# Patient Record
Sex: Female | Born: 1945 | Race: Black or African American | Hispanic: No | Smoking: Never smoker
Health system: Southern US, Community
[De-identification: ages and names within clinical notes are randomized; demographics above are authoritative.]

## PROBLEM LIST (undated history)

## (undated) DIAGNOSIS — E119 Type 2 diabetes mellitus without complications: Secondary | ICD-10-CM

## (undated) DIAGNOSIS — D8989 Other specified disorders involving the immune mechanism, not elsewhere classified: Secondary | ICD-10-CM

## (undated) HISTORY — DX: Type 2 diabetes mellitus without complications: E11.9

## (undated) HISTORY — PX: ABDOMINAL SURGERY: SHX537

## (undated) HISTORY — DX: Other specified disorders involving the immune mechanism, not elsewhere classified: D89.89

---

## 2017-06-28 ENCOUNTER — Emergency Department (HOSPITAL_COMMUNITY): Payer: Self-pay

## 2017-06-28 ENCOUNTER — Emergency Department (HOSPITAL_COMMUNITY)
Admission: EM | Admit: 2017-06-28 | Discharge: 2017-06-28 | Disposition: A | Discharge status: Home / Self Care | Payer: Self-pay | Attending: Physician Assistant | Admitting: Physician Assistant

## 2017-06-28 ENCOUNTER — Encounter (HOSPITAL_COMMUNITY): Payer: Self-pay

## 2017-06-28 DIAGNOSIS — R0789 Other chest pain: Secondary | ICD-10-CM | POA: Insufficient documentation

## 2017-06-28 DIAGNOSIS — R0602 Shortness of breath: Secondary | ICD-10-CM | POA: Insufficient documentation

## 2017-06-28 LAB — BASIC METABOLIC PANEL
Anion gap: 6 (ref 5–15)
BUN: 21 mg/dL — AB (ref 6–20)
CALCIUM: 9.2 mg/dL (ref 8.9–10.3)
CO2: 25 mmol/L (ref 22–32)
CREATININE: 0.98 mg/dL (ref 0.44–1.00)
Chloride: 105 mmol/L (ref 101–111)
GFR calc Af Amer: >60 mL/min (ref >60)
GFR calc non Af Amer: 57 mL/min — ABNORMAL LOW (ref >60)
GLUCOSE: 169 mg/dL — AB (ref 65–99)
Potassium: 4.7 mmol/L (ref 3.5–5.1)
Sodium: 136 mmol/L (ref 135–145)

## 2017-06-28 LAB — CBC
HCT: 32.9 % — ABNORMAL LOW (ref 36.0–46.0)
Hemoglobin: 11.2 g/dL — ABNORMAL LOW (ref 12.0–15.0)
MCH: 30.4 pg (ref 26.0–34.0)
MCHC: 34 g/dL (ref 30.0–36.0)
MCV: 89.2 fL (ref 78.0–100.0)
PLATELETS: 114 10*3/uL — AB (ref 150–400)
RBC: 3.69 MIL/uL — ABNORMAL LOW (ref 3.87–5.11)
RDW: 15 % (ref 11.5–15.5)
WBC: 4.1 10*3/uL (ref 4.0–10.5)

## 2017-06-28 LAB — I-STAT TROPONIN, ED
TROPONIN I, POC: 0 ng/mL (ref 0.00–0.08)
Troponin i, poc: 0 ng/mL (ref 0.00–0.08)

## 2017-06-28 MED ORDER — IOPAMIDOL (ISOVUE-370) INJECTION 76%
INTRAVENOUS | Status: AC
  Administered 2017-06-28: 100 mL via INTRAVENOUS
  Filled 2017-06-28: qty 100

## 2017-06-28 NOTE — ED Triage Notes (Addendum)
Per Pt and family, Pt is coming from [place] two weeks ago. Pt complains of chest pain and SOB for one year. Pt has no healthcare hx, but reports having abdominal surgery in 1988.

## 2017-06-28 NOTE — Discharge Instructions (Signed)
You were seen today with chest pain for the last year. We cannot find any acute cause of these. We do think you needs follow-up with a cardiologist.   Also you were found to have abnormal number of platelets and enlarged spleen, this may be chronic but you will need to see a primary for work up.  You to follow up with primary care physician.We already made an appointment for you, but will need to go to it.

## 2017-06-28 NOTE — ED Notes (Signed)
Pt taken to CT.

## 2017-06-28 NOTE — ED Provider Notes (Signed)
[facility]PT Provider Note   CSN: [id number] Arrival date & time: 06/28/17  0900     History   Chief Complaint Chief Complaint  Patient presents with  . Chest Pain    HPI Terri Long is a 71 y.o. female.  HPI  Patient is 71 year old female from [place]. Patient brought here by son. Son reports that he brought her here and states 2 weeks ago.  He brought her here for checkup. He reports that she's had chest pain and shortness of breath for over a year. It is mostly when walking long distances. Patient does not have any chest pain or shortness of breath currently. Patient had no past medical history except remote surgery.  Son is uses interpreter as patient speaks dialect is unobtainable by [employer].  History reviewed. No pertinent past medical history.  There are no active problems to display for this patient.   Past Surgical History:  Procedure Laterality Date  . ABDOMINAL SURGERY      OB History    No data available       Home Medications    Prior to Admission medications   Not on File    Family History No family history on file.  Social History Social History  Substance Use Topics  . Smoking status: Never Smoker  . Smokeless tobacco: Never Used  . Alcohol use No     Allergies   Patient has no known allergies.   Review of Systems Review of Systems  Respiratory: Positive for shortness of breath.   Cardiovascular: Positive for chest pain.  All other systems reviewed and are negative.    Physical Exam Updated Vital Signs BP (!) 128/94   Pulse 63   Temp 98.8 F (37.1 C) (Oral)   Resp 19   SpO2 98%   Physical Exam  Constitutional: She is oriented to person, place, and time. She appears well-developed and well-nourished.  HENT:  Head: Normocephalic and atraumatic.  Eyes: Pupils are equal, round, and reactive to light. EOM are normal. Right eye exhibits no discharge. Left eye exhibits no discharge.  Neck: Normal range  of motion. Neck supple.  Cardiovascular: Normal rate, regular rhythm and normal heart sounds.   No murmur heard. Pulmonary/Chest: Effort normal and breath sounds normal. She has no wheezes. She has no rales.  Abdominal: Soft. She exhibits no distension. There is no tenderness.  Neurological: She is oriented to person, place, and time.  Skin: Skin is warm and dry. She is not diaphoretic.  Psychiatric:  Unable to tell given language barrier.  Nursing note and vitals reviewed.    ED Treatments / Results  Labs (all labs ordered are listed, but only abnormal results are displayed) Labs Reviewed  BASIC METABOLIC PANEL - Abnormal; Notable for the following:       Result Value   Glucose, Bld 169 (*)    BUN 21 (*)    GFR calc non Af Amer 57 (*)    All other components within normal limits  CBC - Abnormal; Notable for the following:    RBC 3.69 (*)    Hemoglobin 11.2 (*)    HCT 32.9 (*)    Platelets 114 (*)    All other components within normal limits  I-STAT TROPONIN, ED  I-STAT TROPONIN, ED    EKG  EKG Interpretation None       Radiology Dg Chest 2 View  Result Date: 06/28/2017 CLINICAL DATA:  One year history of chest pain and shortness of  breath. Patient recently came to this country from [place]. EXAM: CHEST  2 VIEW COMPARISON:  None in PACs FINDINGS: The lungs are mildly hyperinflated. There is no focal infiltrate. There is no pleural effusion or pneumothorax. The heart is top-normal in size. The pulmonary vascularity is normal. There is tortuosity of the ascending and descending thoracic aorta. The bony thorax exhibits no acute abnormality. IMPRESSION: Tortuous thoracic aorta. An aneurysm is not excluded. A CT angiogram of the chest is recommended. Probable underline chronic bronchitic changes Electronically Signed   By: David  [place] M.D.   On: 06/28/2017 09:50   Ct Angio Chest Aorta W And/or Wo Contrast  Result Date: 06/28/2017 CLINICAL DATA:  Chest pain, shortness of  Breath. EXAM: CT ANGIOGRAPHY CHEST WITH CONTRAST TECHNIQUE: Multidetector CT imaging of the chest was performed using the standard protocol during bolus administration of intravenous contrast. Multiplanar CT image reconstructions and MIPs were obtained to evaluate the vascular anatomy. CONTRAST:  100 cc Isovue 370 IV COMPARISON:  06/28/2017 FINDINGS: Cardiovascular: Heart is mildly enlarged. Ectasia and tortuosity of the thoracic aorta without aneurysm. Scattered aortic calcifications. No filling defects in the pulmonary arteries to suggest pulmonary emboli. Mediastinum/Nodes: No mediastinal, hilar, or axillary adenopathy. Trachea and esophagus are unremarkable. Lungs/Pleura: Mild peribronchial thickening centrally and in the lower lobes. No confluent opacities or effusions. Upper Abdomen: There appears to be splenomegaly. The spleen is not imaged in its entirety. Gallstones noted within the gallbladder. Musculoskeletal: Chest wall soft tissues are unremarkable. No acute bony abnormality. Review of the MIP images confirms the above findings. IMPRESSION: No evidence of pulmonary embolus. Mild cardiomegaly. Tortuosity and ectasia of the thoracic aorta. Bronchial wall thickening compatible with bronchitis. Probable splenomegaly. The spleen is not imaged in its entirety on this CT chest. Electronically Signed   By: Charlett NoseKevin  Dover M.D.   On: 06/28/2017 13:39    Procedures Procedures (including critical care time)  Medications Ordered in ED Medications  iopamidol (ISOVUE-370) 76 % injection (100 mLs Intravenous Contrast Given 06/28/17 1319)     Initial Impression / Assessment and Plan / ED Course  I have reviewed the triage vital signs and the nursing notes.  Pertinent labs & imaging results that were available during my care of the patient were reviewed by me and considered in my medical decision making (see chart for details).     Well-appearing 71 year old female with no past medical history presenting  with chest pain and shortness of breath with lost of exertion, which has been happening over the last year. Patient really brought by son for a "physical". Screening lab work and EKG and chest x-ray done. Chest x-ray showed questionable thoracic aneurysm, so CT angiogram ordered. Patient has normal physical exam and vital signs.  4:35 PM CT angio shows no aneurysm.  Enlarged spleen with abnormal platelets. Patient from [place] could be secondary to chronic malaria. We will have him follow-up with her primary care physician for workup. Called case management and had them make an appointment for patient for follow-up.  Final Clinical Impressions(s) / ED Diagnoses   Final diagnoses:  Other chest pain    New Prescriptions There are no discharge medications for this patient.    Abelino DerrickMackuen, Courteney Lyn, MD 06/28/17 1635

## 2017-06-28 NOTE — ED Notes (Signed)
Pt verbalized understanding of d/c instructions and has no further questions. Pt stable and nAD. VSS. Pt to follow up at pcp and appointment has already been made.

## 2017-07-22 ENCOUNTER — Inpatient Hospital Stay (INDEPENDENT_AMBULATORY_CARE_PROVIDER_SITE_OTHER): Payer: Self-pay | Admitting: Physician Assistant

## 2017-07-22 ENCOUNTER — Encounter (INDEPENDENT_AMBULATORY_CARE_PROVIDER_SITE_OTHER): Payer: Self-pay | Admitting: Physician Assistant

## 2017-07-22 ENCOUNTER — Ambulatory Visit (INDEPENDENT_AMBULATORY_CARE_PROVIDER_SITE_OTHER): Payer: Self-pay | Admitting: Physician Assistant

## 2017-07-22 VITALS — BP 124/81 | HR 82 | Temp 97.9°F | Resp 18 | Ht 62.0 in | Wt 183.0 lb

## 2017-07-22 DIAGNOSIS — D696 Thrombocytopenia, unspecified: Secondary | ICD-10-CM

## 2017-07-22 DIAGNOSIS — R161 Splenomegaly, not elsewhere classified: Secondary | ICD-10-CM

## 2017-07-22 DIAGNOSIS — R0602 Shortness of breath: Secondary | ICD-10-CM

## 2017-07-22 DIAGNOSIS — D649 Anemia, unspecified: Secondary | ICD-10-CM

## 2017-07-22 MED ORDER — ALBUTEROL SULFATE HFA 108 (90 BASE) MCG/ACT IN AERS: 2.0000 | INHALATION_SPRAY | RESPIRATORY_TRACT | 0 refills | Status: DC | PRN

## 2017-07-22 NOTE — Patient Instructions (Signed)
Enlarged Spleen An enlarged spleen (splenomegaly) is when the spleen is larger than normal. This condition is usually noticed when the spleen is almost twice its normal size. The spleen is an organ that is located in the upper left area of the abdomen, just under the ribs. The spleen is like a storage unit for red blood cells, and it also works to filter and clean the blood. It destroys cells that are damaged or worn out. The spleen is also important for fighting disease. An enlarged spleen is usually a sign of another health problem. What are the causes? This condition may be caused by:  Mononucleosis and some other viral infections.  Infection with certain bacteria or parasites.  Liver failure (cirrhosis) and other liver diseases.  Blood diseases, such as hemolytic anemia.  An overactive spleen (hypersplenism).  Blood cancers, such as leukemia or Hodgkin disease.  Metabolic disorders, such as Gaucher disease or Niemann-Pick disease.  Tumors and cysts.  Pressure or blood clots in the veins of the spleen.  Connective tissue disorders, such as lupus or rheumatoid arthritis.  What are the signs or symptoms? Symptoms of this condition include:  Pain in the upper left part of the abdomen. The pain may spread to the left shoulder or get worse when you take a breath.  Feeling full without eating or after eating only a small amount.  Feeling tired.  Chronic infections.  Bleeding or bruising easily.  In some cases, there are no symptoms. How is this diagnosed? This condition may be diagnosed during a physical exam when the health care provider feels the left upper part of your abdomen. You may also have tests, such as:  Blood tests to check red and white blood cells and other proteins and enzymes.  Imaging tests, such as an abdominal ultrasound, CT scan, or MRI.  Taking a tissue sample (biopsy) of the liver or bone marrow if there is concern that it is the cause of an enlarged  spleen.  How is this treated? Treatment for this condition depends on the cause. Treatment aims to manage the conditions that cause swelling of the spleen and reduce the size of the spleen. Treatment may include:  Medicines to treat infection or disease.  Radiation therapy.  Blood transfusions.  Vaccinations.  If these treatments do not help or if the cause cannot be found, surgery to remove the spleen (splenectomy) may be recommended. Follow these instructions at home:  Take over-the-counter and prescription medicines only as told by your health care provider.  If you were prescribed an antibiotic medicine, take it as told by your health care provider. Do not stop taking the antibiotic even if you start to feel better.  Follow instructions from your health care provider about limiting your activities. To avoid injury or a ruptured spleen, make sure you: ? Avoid contact sports. ? Wear a seat belt in the car.  Keep all follow-up visits as told by your health care provider. This is important. Contact a health care provider if:  Your symptoms do not improve as expected.  You have a fever or chills.  You feel generally ill.  You have increased pain when you take in a breath. Get help right away if:  You experience an injury or impact to the spleen area.  Your abdominal pain becomes severe.  You feel dizzy or you faint.  You feel very weak.  You have cold and clammy skin.  You have sweating for no reason.  You have chest   pain or difficulty breathing. This information is not intended to replace advice given to you by your health care provider. Make sure you discuss any questions you have with your health care provider. Document Released: 04/21/2010 Document Revised: 04/08/2016 Document Reviewed: 04/21/2015 Elsevier Interactive Patient Education  2018 Elsevier Inc.  

## 2017-07-22 NOTE — Progress Notes (Signed)
Subjective:  Patient ID: Terri Long, female    DOB: 1946-08-30  Age: 71 y.o. MRN: [id number]  CC: hospital f/u  HPI Terri Long is a 71 y.o. female with no significant medical history presents on hospital f/u for complaint of chest pain and SOB for over one year. Hospital visit on 06/28/17. Laboratory and CT  Imaging workup revealed probable enlarged spleen, gallstones, platelets 114 K/uL, Hgb 11.2 g/dL, and glucose of 191169. Troponins negative. Chest XR could not exclude aortic aneurysm but was excluded with subsequent CT.  Pt endorses epigastric and LUQ "pulling sensation". The pulling sensation radiates to the left flank.  ROS Review of Systems  Constitutional: Negative for chills, fever and malaise/fatigue.  Eyes: Negative for blurred vision.  Respiratory: Positive for shortness of breath.   Cardiovascular: Negative for chest pain and palpitations.  Gastrointestinal: Positive for abdominal pain. Negative for nausea.  Genitourinary: Negative for dysuria and hematuria.  Musculoskeletal: Negative for joint pain and myalgias.  Skin: Negative for rash.  Neurological: Negative for tingling and headaches.  Psychiatric/Behavioral: Negative for depression. The patient is not nervous/anxious.     Objective:  BP 124/81 (BP Location: Right Arm, Patient Position: Sitting, Cuff Size: Large)   Pulse 82   Temp 97.9 F (36.6 C) (Oral)   Resp 18   Ht 5\' 2"  (1.575 m)   Wt 183 lb (83 kg)   SpO2 100%   BMI 33.47 kg/m   BP/Weight 07/22/2017 06/28/2017  Systolic BP 124 128  Diastolic BP 81 94  Wt. (Lbs) 183 -  BMI 33.47 -      Physical Exam  Constitutional: She is oriented to person, place, and time.  Well developed, moderate abdominal obesity, NAD, polite, reserved  HENT:  Head: Normocephalic and atraumatic.  Mouth/Throat: No oropharyngeal exudate.  Eyes: Conjunctivae are normal. No scleral icterus.  Neck: Normal range of motion. Neck supple. No thyromegaly present.   Cardiovascular: Normal rate, regular rhythm and normal heart sounds.   Pulmonary/Chest: Effort normal and breath sounds normal.  Abdominal: Soft. Bowel sounds are normal. There is no tenderness.  Moderate splenomegaly, no hepatomegaly felt. Mild TTP over the epigastrium and LUQ.  Musculoskeletal: She exhibits no edema.  Lymphadenopathy:    She has no cervical adenopathy.  Neurological: She is alert and oriented to person, place, and time. No cranial nerve deficit. Coordination normal.  Skin: Skin is warm and dry. No rash noted. No erythema. No pallor.  Psychiatric: She has a normal mood and affect. Her behavior is normal. Thought content normal.  Vitals reviewed.    Assessment & Plan:   1. Splenomegaly - Pathologist smear review - Comprehensive metabolic panel - CBC with Differential - Hepatitis panel, acute - ANA - Hemoglobinopathy evaluation - PPD - Mononucleosis screen  2. Anemia, unspecified type - Pathologist smear review - Comprehensive metabolic panel - CBC with Differential - Hepatitis panel, acute - ANA - Hemoglobinopathy evaluation  3. Thrombocytopenia (HCC) - Pathologist smear review - Comprehensive metabolic panel - CBC with Differential - Hepatitis panel, acute - ANA - Hemoglobinopathy evaluation  4. SOB (shortness of breath) - PPD - Begin Albuterol inhaler as needed.   Meds ordered this encounter  Medications  . albuterol (PROVENTIL HFA;VENTOLIN HFA) 108 (90 Base) MCG/ACT inhaler    Sig: Inhale 2 puffs into the lungs every 4 (four) hours as needed for wheezing or shortness of breath.    Dispense:  1 Inhaler    Refill:  0    Order Specific  Question:   Supervising Provider    Answer:   Quentin Angst [[id number]]    Follow-up: Return in about 2 weeks (around 08/05/2017).   Loletta Specter PA

## 2017-07-25 ENCOUNTER — Ambulatory Visit (INDEPENDENT_AMBULATORY_CARE_PROVIDER_SITE_OTHER): Payer: Self-pay

## 2017-07-25 VITALS — Temp 98.9°F

## 2017-07-25 DIAGNOSIS — Z111 Encounter for screening for respiratory tuberculosis: Secondary | ICD-10-CM

## 2017-07-25 LAB — COMPREHENSIVE METABOLIC PANEL
ALT: 14 IU/L (ref 0–32)
AST: 22 IU/L (ref 0–40)
Albumin/Globulin Ratio: 0.8 — ABNORMAL LOW (ref 1.2–2.2)
Albumin: 4 g/dL (ref 3.5–4.8)
Alkaline Phosphatase: 81 IU/L (ref 39–117)
BUN/Creatinine Ratio: 20 (ref 12–28)
BUN: 21 mg/dL (ref 8–27)
Bilirubin Total: 0.7 mg/dL (ref 0.0–1.2)
CALCIUM: 9.8 mg/dL (ref 8.7–10.3)
CHLORIDE: 102 mmol/L (ref 96–106)
CO2: 24 mmol/L (ref 20–29)
Creatinine, Ser: 1.05 mg/dL — ABNORMAL HIGH (ref 0.57–1.00)
GFR, EST AFRICAN AMERICAN: 62 mL/min/{1.73_m2} (ref >59)
GFR, EST NON AFRICAN AMERICAN: 54 mL/min/{1.73_m2} — AB (ref >59)
GLUCOSE: 221 mg/dL — AB (ref 65–99)
Globulin, Total: 4.9 g/dL — ABNORMAL HIGH (ref 1.5–4.5)
Potassium: 4.5 mmol/L (ref 3.5–5.2)
Sodium: 140 mmol/L (ref 134–144)
TOTAL PROTEIN: 8.9 g/dL — AB (ref 6.0–8.5)

## 2017-07-25 LAB — CBC WITH DIFFERENTIAL/PLATELET
BASOS ABS: 0 10*3/uL (ref 0.0–0.2)
BASOS: 0 %
EOS (ABSOLUTE): 0.2 10*3/uL (ref 0.0–0.4)
Eos: 5 %
Hematocrit: 33.4 % — ABNORMAL LOW (ref 34.0–46.6)
Hemoglobin: 10.9 g/dL — ABNORMAL LOW (ref 11.1–15.9)
IMMATURE GRANS (ABS): 0 10*3/uL (ref 0.0–0.1)
IMMATURE GRANULOCYTES: 0 %
LYMPHS: 38 %
Lymphocytes Absolute: 1.2 10*3/uL (ref 0.7–3.1)
MCH: 30 pg (ref 26.6–33.0)
MCHC: 32.6 g/dL (ref 31.5–35.7)
MCV: 92 fL (ref 79–97)
MONOCYTES: 6 %
Monocytes Absolute: 0.2 10*3/uL (ref 0.1–0.9)
NEUTROS ABS: 1.5 10*3/uL (ref 1.4–7.0)
NEUTROS PCT: 51 %
Platelets: 100 10*3/uL — CL (ref 150–379)
RBC: 3.63 x10E6/uL — ABNORMAL LOW (ref 3.77–5.28)
RDW: 15.1 % (ref 12.3–15.4)
WBC: 3.1 10*3/uL — ABNORMAL LOW (ref 3.4–10.8)

## 2017-07-25 LAB — PATHOLOGIST SMEAR REVIEW
Basophils Absolute: 0 10*3/uL (ref 0.0–0.2)
Basos: 0 %
EOS (ABSOLUTE): 0.2 10*3/uL (ref 0.0–0.4)
Eos: 5 %
HEMATOCRIT: 33.4 % — AB (ref 34.0–46.6)
Hemoglobin: 11 g/dL — ABNORMAL LOW (ref 11.1–15.9)
Immature Grans (Abs): 0 10*3/uL (ref 0.0–0.1)
Immature Granulocytes: 0 %
LYMPHS ABS: 1.5 10*3/uL (ref 0.7–3.1)
Lymphs: 41 %
MCH: 31.8 pg (ref 26.6–33.0)
MCHC: 32.9 g/dL (ref 31.5–35.7)
MCV: 97 fL (ref 79–97)
MONOCYTES: 5 %
MONOS ABS: 0.2 10*3/uL (ref 0.1–0.9)
NEUTROS ABS: 1.7 10*3/uL (ref 1.4–7.0)
Neutrophils: 49 %
Platelets: 96 10*3/uL — CL (ref 150–379)
RBC: 3.46 x10E6/uL — AB (ref 3.77–5.28)
RDW: 15.5 % — ABNORMAL HIGH (ref 12.3–15.4)
WBC: 3.5 10*3/uL (ref 3.4–10.8)

## 2017-07-25 LAB — ANA: ANA: POSITIVE — AB

## 2017-07-25 LAB — HEMOGLOBINOPATHY EVALUATION
HGB C: 0 %
HGB S: 0 %
HGB VARIANT: 0 %
Hemoglobin A2 Quantitation: 2 % (ref 1.8–3.2)
Hemoglobin F Quantitation: 0 % (ref 0.0–2.0)
Hgb A: 98 % (ref 96.4–98.8)

## 2017-07-25 LAB — TB SKIN TEST
INDURATION: 0 mm
TB Skin Test: NEGATIVE

## 2017-07-25 LAB — MONONUCLEOSIS SCREEN: MONO SCREEN: NEGATIVE

## 2017-07-25 LAB — HEPATITIS PANEL, ACUTE
HEP A IGM: NEGATIVE
HEP B C IGM: NEGATIVE
Hepatitis B Surface Ag: NEGATIVE

## 2017-07-25 NOTE — Patient Instructions (Signed)
Patient received a letter in hand stating negative result.

## 2017-07-26 NOTE — Progress Notes (Signed)
Patients lab was positive for an autoimmune antibody. Patient needs to return for a further workup. Please schedule.

## 2017-08-09 ENCOUNTER — Ambulatory Visit (INDEPENDENT_AMBULATORY_CARE_PROVIDER_SITE_OTHER): Payer: Self-pay | Admitting: Physician Assistant

## 2017-08-09 ENCOUNTER — Encounter (INDEPENDENT_AMBULATORY_CARE_PROVIDER_SITE_OTHER): Payer: Self-pay | Admitting: Physician Assistant

## 2017-08-09 VITALS — BP 133/85 | HR 73 | Temp 98.0°F | Wt 152.6 lb

## 2017-08-09 DIAGNOSIS — D696 Thrombocytopenia, unspecified: Secondary | ICD-10-CM

## 2017-08-09 DIAGNOSIS — R768 Other specified abnormal immunological findings in serum: Secondary | ICD-10-CM

## 2017-08-09 DIAGNOSIS — E119 Type 2 diabetes mellitus without complications: Secondary | ICD-10-CM

## 2017-08-09 DIAGNOSIS — R161 Splenomegaly, not elsewhere classified: Secondary | ICD-10-CM

## 2017-08-09 DIAGNOSIS — H04123 Dry eye syndrome of bilateral lacrimal glands: Secondary | ICD-10-CM

## 2017-08-09 DIAGNOSIS — R739 Hyperglycemia, unspecified: Secondary | ICD-10-CM

## 2017-08-09 LAB — POCT GLYCOSYLATED HEMOGLOBIN (HGB A1C): Hemoglobin A1C: 7.3

## 2017-08-09 MED ORDER — METFORMIN HCL 500 MG PO TABS: 500.0000 mg | ORAL_TABLET | Freq: Two times a day (BID) | ORAL | 5 refills | Status: AC

## 2017-08-09 MED ORDER — CARBOXYMETHYLCELLULOSE SODIUM 0.25 % OP SOLN: 1.0000 [drp] | Freq: Every day | OPHTHALMIC | 0 refills | Status: AC

## 2017-08-09 NOTE — Progress Notes (Signed)
Subjective:  Patient ID: Terri Long, female    DOB: Jun 25, 1946  Age: 71 y.o. MRN: [id number]  CC: f/u multiple complaints.  HPI  Terri Long is a 71 y.o. female with no significant medical history presents on f/u for complaint of chest pain and SOB for over one year. Hospital visit on 06/28/17. Laboratory and CT  Imaging workup revealed probable enlarged spleen, gallstones, platelets 114 K/uL, Hgb 11.2 g/dL, and glucose of 782. Troponins negative. Chest XR could not exclude aortic aneurysm but was excluded with subsequent CT.  Pt endorses epigastric and LUQ "pulling sensation". The pulling sensation radiates to the left flank.     She had been worked up for different causes of splenomegaly to include Monospot, PPD, Pathology blood smear review, CMP, CBC, Hepatitis panel, ANA, and hemoglobinopathy evaluation. Pertinent findings include ANA positive, Plts 96, Glucose 221, and Hgb 10.9. Endorses polyuria and visual blurring (attributed to dry eyes). Does not endorse polydipsia, polyphagia, tingling, numbness, or fatigue.   Son is the interpreter and states pt is doing generally well but has occasional mild LUQ and epigastric pain that self resolve in less than an hour. Patient believes pain is exacerbated with walking long distances. Does not endorse CP, palpitations, SOB, HA, f/c/n/v, rash, hematochezia, melena, constipation, diarrhea, or dysuria. Patient's son says mother is here on visitor's visa and is worried about the costs of evaluation and treatment.     Outpatient Medications Prior to Visit  Medication Sig Dispense Refill  . albuterol (PROVENTIL HFA;VENTOLIN HFA) 108 (90 Base) MCG/ACT inhaler Inhale 2 puffs into the lungs every 4 (four) hours as needed for wheezing or shortness of breath. (Patient not taking: Reported on 08/09/2017) 1 Inhaler 0   No facility-administered medications prior to visit.      ROS Review of Systems  Constitutional: Negative for chills, fever and  malaise/fatigue.  Eyes: Negative for blurred vision.       Dry eyes  Respiratory: Negative for shortness of breath.   Cardiovascular: Negative for chest pain and palpitations.  Gastrointestinal: Positive for abdominal pain. Negative for blood in stool, constipation, diarrhea, heartburn, melena, nausea and vomiting.  Genitourinary: Negative for dysuria and hematuria.  Musculoskeletal: Negative for joint pain and myalgias.  Skin: Negative for rash.  Neurological: Negative for tingling and headaches.  Psychiatric/Behavioral: Negative for depression. The patient is not nervous/anxious.     Objective:  BP 133/85 (BP Location: Right Arm, Patient Position: Sitting, Cuff Size: Large)   Pulse 73   Temp 98 F (36.7 C) (Oral)   Wt 152 lb 9.6 oz (69.2 kg)   SpO2 99%   BMI 27.91 kg/m   BP/Weight 08/09/2017 07/22/2017 06/28/2017  Systolic BP 133 124 128  Diastolic BP 85 81 94  Wt. (Lbs) 152.6 183 -  BMI 27.91 33.47 -      Physical Exam  Constitutional: She is oriented to person, place, and time.  Well developed, well nourished, NAD, polite  HENT:  Head: Normocephalic and atraumatic.  Eyes: Conjunctivae are normal. No scleral icterus.  Neck: Normal range of motion. Neck supple. No thyromegaly present.  Cardiovascular: Normal rate, regular rhythm and normal heart sounds.   Pulmonary/Chest: Effort normal and breath sounds normal.  Abdominal: Soft. Bowel sounds are normal. There is no tenderness.  Splenomegaly  Musculoskeletal: She exhibits no edema.  Neurological: She is alert and oriented to person, place, and time. No cranial nerve deficit. Coordination normal.  Skin: Skin is warm and dry. No rash noted. No  erythema. No pallor.  Psychiatric: She has a normal mood and affect. Her behavior is normal. Thought content normal.  Vitals reviewed.    Assessment & Plan:     1. Splenomegaly - Urgent ambulatory referral to General Surgery. There is the complication of thrombocytopenia of  114 and 100 on repeat. Etiology of splenomegaly and thrombocytopenia unknown at this point but an immune process is suspected.  2. ANA positive - ANA w/Reflex  3. 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 ANA with reflex has been ordered as ANA is positive.  4. Type 2 diabetes mellitus without complication, without long-term current use of insulin (HCC) - Begin metFORMIN (GLUCOPHAGE) 500 MG tablet; Take 1 tablet (500 mg total) by mouth 2 (two) times daily with a meal.  Dispense: 60 tablet; Refill: 5  5. Hyperglycemia - HgB A1c 7.3% in clinic today.  6. Dry eyes - Carboxymethylcellulose Sodium (THERATEARS) 0.25 % SOLN; Apply 1 drop to eye daily.  Dispense: 1 each; Refill: 0 - May be due to autoimmune disease   Meds ordered this encounter  Medications  . Carboxymethylcellulose Sodium (THERATEARS) 0.25 % SOLN    Sig: Apply 1 drop to eye daily.    Dispense:  1 each    Refill:  0    Order Specific Question:   Supervising Provider    Answer:   Quentin Angst [id number]  . metFORMIN (GLUCOPHAGE) 500 MG tablet    Sig: Take 1 tablet (500 mg total) by mouth 2 (two) times daily with a meal.    Dispense:  60 tablet    Refill:  5    Order Specific Question:   Supervising Provider    AnswerQuentin Angst [[id number]]    Follow-up: 3 months for diabetes  Loletta Specter PA

## 2017-08-09 NOTE — Patient Instructions (Signed)
Antinuclear Antibody Test Why am I having this test? This is a test used to help diagnose systemic lupus erythematosus (SLE) and other autoimmune diseases. An autoimmune disease is a disease in which the body's own defense system (immune system) attacks the body. This test checks for antinuclear antibodies (ANA) in the blood. The presence of ANA is associated with several autoimmune diseases. It is most commonly seen with SLE. What kind of sample is taken? A blood sample is required for this test. It is usually collected by inserting a needle into a vein. How do I prepare for this test? There is no preparation required for this test. How are the results reported? Your test results will be reported as either positive or negative. It is your responsibility to obtain your test results. Ask the lab or department performing the test when and how you will get your results. What do the results mean? A positive test may mean you have:  SLE.  An autoimmune disease.  Liver dysfunction.  Leukemia.  Infectious mononucleosis.  Talk with your health care provider to discuss your results, treatment options, and if necessary, the need for more tests. Talk with your health care provider if you have any questions about your results. Talk with your health care provider to discuss your results, treatment options, and if necessary, the need for more tests. Talk with your health care provider if you have any questions about your results. This information is not intended to replace advice given to you by your health care provider. Make sure you discuss any questions you have with your health care provider. Document Released: 11/23/2004 Document Revised: 07/05/2016 Document Reviewed: 04/02/2014 Elsevier Interactive Patient Education  2018 Elsevier Inc.  

## 2017-08-10 ENCOUNTER — Other Ambulatory Visit (INDEPENDENT_AMBULATORY_CARE_PROVIDER_SITE_OTHER): Payer: Self-pay | Admitting: Physician Assistant

## 2017-08-10 DIAGNOSIS — D8989 Other specified disorders involving the immune mechanism, not elsewhere classified: Secondary | ICD-10-CM | POA: Insufficient documentation

## 2017-08-10 HISTORY — DX: Other specified disorders involving the immune mechanism, not elsewhere classified: D89.89

## 2017-08-10 LAB — ENA+DNA/DS+SJORGEN'S
ENA RNP Ab: 0.8 AI (ref 0.0–0.9)
ENA SSA (RO) AB: 0.5 AI (ref 0.0–0.9)
ENA SSB (LA) Ab: 7.4 AI — ABNORMAL HIGH (ref 0.0–0.9)
dsDNA Ab: 1 IU/mL (ref 0–9)

## 2017-08-10 LAB — ANA W/REFLEX: Anti Nuclear Antibody(ANA): POSITIVE — AB

## 2017-08-12 ENCOUNTER — Telehealth (INDEPENDENT_AMBULATORY_CARE_PROVIDER_SITE_OTHER): Payer: Self-pay

## 2017-08-12 NOTE — Telephone Encounter (Signed)
-----   Message from Loletta Specter, PA-C sent at 08/10/2017  6:01 PM EDT ----- Seems patient has Sjogren's Syndrome. Will need to see the rheumatologist. I will put in referral now.

## 2017-08-12 NOTE — Telephone Encounter (Signed)
Patients phone rang several times, then disconnected. Will try patient again. Maryjean Morn, CMA

## 2017-08-18 ENCOUNTER — Encounter (INDEPENDENT_AMBULATORY_CARE_PROVIDER_SITE_OTHER): Payer: Self-pay | Admitting: Physician Assistant

## 2017-08-18 NOTE — Progress Notes (Signed)
Unable to contact patient no voice mail . Mail a letter to give [place] a call to schedule an appointment

## 2017-08-31 ENCOUNTER — Ambulatory Visit: Payer: Self-pay

## 2017-09-19 ENCOUNTER — Ambulatory Visit: Payer: Self-pay | Admitting: Surgery

## 2017-09-19 NOTE — Progress Notes (Signed)
I try so many times . Unable to contact patient

## 2017-09-26 ENCOUNTER — Ambulatory Visit (INDEPENDENT_AMBULATORY_CARE_PROVIDER_SITE_OTHER): Payer: Self-pay | Admitting: Physician Assistant

## 2017-09-26 ENCOUNTER — Encounter (INDEPENDENT_AMBULATORY_CARE_PROVIDER_SITE_OTHER): Payer: Self-pay | Admitting: Physician Assistant

## 2017-09-26 VITALS — BP 149/65 | HR 65 | Temp 97.8°F | Resp 18 | Ht 60.0 in | Wt 153.0 lb

## 2017-09-26 DIAGNOSIS — R161 Splenomegaly, not elsewhere classified: Secondary | ICD-10-CM

## 2017-09-26 DIAGNOSIS — E119 Type 2 diabetes mellitus without complications: Secondary | ICD-10-CM

## 2017-09-26 DIAGNOSIS — Z23 Encounter for immunization: Secondary | ICD-10-CM

## 2017-09-26 HISTORY — DX: Type 2 diabetes mellitus without complications: E11.9

## 2017-09-26 LAB — GLUCOSE, POCT (MANUAL RESULT ENTRY): POC GLUCOSE: 140 mg/dL — AB (ref 70–99)

## 2017-09-26 MED ORDER — BLOOD GLUCOSE MONITOR KIT: PACK | 0 refills | Status: AC

## 2017-09-26 MED FILL — TRUE METRIX TEST STRIP: 30 days supply | Qty: 100 | Fill #0

## 2017-09-26 MED FILL — !TRUE METRIX BLOOD GLUCOSE: 365 days supply | Qty: 1 | Fill #0

## 2017-09-26 MED FILL — TRUEplus LANCETS 30G MISC: 30 days supply | Qty: 100 | Fill #0

## 2017-09-26 MED FILL — ?METFORMIN HCL 500MG TABLET: 500 | 30 days supply | Qty: 60 | Fill #0

## 2017-09-26 NOTE — Progress Notes (Signed)
Subjective:  Patient ID: Terri Long Hosang, female    DOB: [birth year]  Age: 71 y.o. MRN: [id number]  CC: diabetes  HPI Terri Long Worst is a 71 y.o. female with a medical history of splenomegaly, thrombocytopenia, ANA positive with positive SSB antibodies, and DM2 presents to f/u on DM2, splenomegaly and dry eyes. Patient accompanied by her son whom serves as Equities traderinterpreter. Says he did not know she had metformin or Theratears sent to the pharmacy last visit. Also, Pt was referred to rheumatology and referral note says the following:  Pt don't have insurance I send her a letter with the options to go to [facility]  $65.00 and apply for financial assistant or [facility] [address] ph. # [phone] and pay $ 110.  Waiting for patient to answer.  Son says he received the rheumatology information and was deciding on which location to take her. Pt still has complaint of dry eyes.     Patient was urgently referred to surgery for splenomegaly but referral notes show general surgery deciding whether or not to send to tertiary location for surgery. No decision has been made yet. Son reports no calls from general surgery. Patient endorses the same "pulling sensation" of the left flank and the same level of LUQ pain.     Outpatient Medications Prior to Visit  Medication Sig Dispense Refill  . Carboxymethylcellulose Sodium (THERATEARS) 0.25 % SOLN Apply 1 drop to eye daily. 1 each 0  . metFORMIN (GLUCOPHAGE) 500 MG tablet Take 1 tablet (500 mg total) by mouth 2 (two) times daily with a meal. 60 tablet 5  . albuterol (PROVENTIL HFA;VENTOLIN HFA) 108 (90 Base) MCG/ACT inhaler Inhale 2 puffs into the lungs every 4 (four) hours as needed for wheezing or shortness of breath. (Patient not taking: Reported on 08/09/2017) 1 Inhaler 0   No facility-administered medications prior to visit.      ROS Review of Systems  Constitutional: Negative for chills, fever and  malaise/fatigue.  Eyes: Negative for blurred vision.  Respiratory: Negative for shortness of breath.   Cardiovascular: Negative for chest pain and palpitations.  Gastrointestinal: Negative for abdominal pain and nausea.  Genitourinary: Negative for dysuria and hematuria.  Musculoskeletal: Negative for joint pain and myalgias.  Skin: Negative for rash.  Neurological: Negative for tingling and headaches.  Psychiatric/Behavioral: Negative for depression. The patient is not nervous/anxious.     Objective:  BP (!) 149/65 (BP Location: Right Arm, Patient Position: Sitting, Cuff Size: Normal)   Pulse 65   Temp 97.8 F (36.6 C) (Oral)   Resp 18   Ht 5' (1.524 m)   Wt 153 lb (69.4 kg)   SpO2 97%   BMI 29.88 kg/m   BP/Weight 09/26/2017 08/09/2017 07/22/2017  Systolic BP 149 133 124  Diastolic BP 65 85 81  Wt. (Lbs) 153 152.6 183  BMI 29.88 27.91 33.47      Physical Exam  Constitutional: She is oriented to person, place, and time.  Well developed, well nourished, NAD, polite  HENT:  Head: Normocephalic and atraumatic.  Eyes: No scleral icterus.  Neck: Normal range of motion. Neck supple. No thyromegaly present.  Cardiovascular: Normal rate, regular rhythm and normal heart sounds.  Pulmonary/Chest: Effort normal and breath sounds normal. No respiratory distress. She has no wheezes.  Abdominal: Soft. Bowel sounds are normal. There is tenderness (mild LUQ TTP with splenomegaly).  Musculoskeletal: She exhibits no edema.  Neurological: She is alert and oriented  to person, place, and time. No cranial nerve deficit. Coordination normal.  Skin: Skin is warm and dry. No rash noted. No erythema. No pallor.  Psychiatric: She has a normal mood and affect. Her behavior is normal. Thought content normal.  Vitals reviewed.    Assessment & Plan:    1. Type 2 diabetes mellitus without complication, without long-term current use of insulin (HCC) - Glucose (CBG) 140 mg/dL - Begin glucometer,  strips, and lancets  2. Splenomegaly - There is no word from surgery yet. Referral notes show general surgery is communicating amongst themselves to see if patient will be sent  - I have asked referral specialist to call and make them aware that patient is waiting on their decision. Referral specialist returned to me and stated the general surgeon's office said to call the surgeon tomorrow as he is not in office today. Reportedly, the office refused to take a message to route to the surgeon.  - I have sent a message to Karna ChristmasAngela Long Brouillard and Dr. Ancil LinseyJason Evan Davis asking how we should proceed.   3. ANA positive - Pt's son advised to take patient to rheumatology. Son affirmed that he will take her to a rheumatologist. Referral specialist gave rheumatology office information to son.    Follow-up: 4 weeks for splenomegaly.  Loletta Specteroger David Gomez PA

## 2017-09-26 NOTE — Patient Instructions (Signed)
Enlarged Spleen An enlarged spleen (splenomegaly) is when the spleen is larger than normal. This condition is usually noticed when the spleen is almost twice its normal size. The spleen is an organ that is located in the upper left area of the abdomen, just under the ribs. The spleen is like a storage unit for red blood cells, and it also works to filter and clean the blood. It destroys cells that are damaged or worn out. The spleen is also important for fighting disease. An enlarged spleen is usually a sign of another health problem. What are the causes? This condition may be caused by:  Mononucleosis and some other viral infections.  Infection with certain bacteria or parasites.  Liver failure (cirrhosis) and other liver diseases.  Blood diseases, such as hemolytic anemia.  An overactive spleen (hypersplenism).  Blood cancers, such as leukemia or Hodgkin disease.  Metabolic disorders, such as Gaucher disease or Niemann-Pick disease.  Tumors and cysts.  Pressure or blood clots in the veins of the spleen.  Connective tissue disorders, such as lupus or rheumatoid arthritis.  What are the signs or symptoms? Symptoms of this condition include:  Pain in the upper left part of the abdomen. The pain may spread to the left shoulder or get worse when you take a breath.  Feeling full without eating or after eating only a small amount.  Feeling tired.  Chronic infections.  Bleeding or bruising easily.  In some cases, there are no symptoms. How is this diagnosed? This condition may be diagnosed during a physical exam when the health care provider feels the left upper part of your abdomen. You may also have tests, such as:  Blood tests to check red and white blood cells and other proteins and enzymes.  Imaging tests, such as an abdominal ultrasound, CT scan, or MRI.  Taking a tissue sample (biopsy) of the liver or bone marrow if there is concern that it is the cause of an enlarged  spleen.  How is this treated? Treatment for this condition depends on the cause. Treatment aims to manage the conditions that cause swelling of the spleen and reduce the size of the spleen. Treatment may include:  Medicines to treat infection or disease.  Radiation therapy.  Blood transfusions.  Vaccinations.  If these treatments do not help or if the cause cannot be found, surgery to remove the spleen (splenectomy) may be recommended. Follow these instructions at home:  Take over-the-counter and prescription medicines only as told by your health care provider.  If you were prescribed an antibiotic medicine, take it as told by your health care provider. Do not stop taking the antibiotic even if you start to feel better.  Follow instructions from your health care provider about limiting your activities. To avoid injury or a ruptured spleen, make sure you: ? Avoid contact sports. ? Wear a seat belt in the car.  Keep all follow-up visits as told by your health care provider. This is important. Contact a health care provider if:  Your symptoms do not improve as expected.  You have a fever or chills.  You feel generally ill.  You have increased pain when you take in a breath. Get help right away if:  You experience an injury or impact to the spleen area.  Your abdominal pain becomes severe.  You feel dizzy or you faint.  You feel very weak.  You have cold and clammy skin.  You have sweating for no reason.  You have chest   pain or difficulty breathing. This information is not intended to replace advice given to you by your health care provider. Make sure you discuss any questions you have with your health care provider. Document Released: 04/21/2010 Document Revised: 04/08/2016 Document Reviewed: 04/21/2015 Elsevier Interactive Patient Education  2018 Elsevier Inc.  

## 2017-09-29 ENCOUNTER — Ambulatory Visit: Payer: Self-pay | Attending: Internal Medicine | Admitting: *Deleted

## 2017-09-29 ENCOUNTER — Other Ambulatory Visit: Payer: Self-pay

## 2017-09-29 ENCOUNTER — Ambulatory Visit: Payer: Self-pay

## 2017-09-29 DIAGNOSIS — Z23 Encounter for immunization: Secondary | ICD-10-CM | POA: Insufficient documentation

## 2017-09-29 NOTE — Progress Notes (Signed)
Patient here today for vaccinations. Pneumococcal-23 and Tdap given today. Administered  Pneumococcal-23 in left deltoid and   TDap in right deltoid.  Site unremarkable & patient tolerated injection. Guy Francoravia Quintavius Niebuhr, RN,BSN

## 2017-10-05 ENCOUNTER — Ambulatory Visit (INDEPENDENT_AMBULATORY_CARE_PROVIDER_SITE_OTHER): Payer: No Typology Code available for payment source | Admitting: Surgery

## 2017-10-05 ENCOUNTER — Encounter: Payer: Self-pay | Admitting: Surgery

## 2017-10-05 VITALS — BP 171/99 | HR 82 | Temp 97.9°F | Ht 60.0 in | Wt 154.0 lb

## 2017-10-05 DIAGNOSIS — R161 Splenomegaly, not elsewhere classified: Secondary | ICD-10-CM

## 2017-10-05 NOTE — Patient Instructions (Addendum)
We have scheduled you for a CT Scan of your Abdomen and Pelvis. This has been scheduled at 12/7 at our [facility] location. Please Check-in at 7:45, 15 minutes prior to your scheduled appointment. If you need to reschedule your Scan, you may do so by calling [phone].  You will need to pick up a prep kit at least 24 hours in advance of your Scan: You may pick this up at the [facility] department at [place] Location, or [employer].  Bring a list of medications with you to your appointment and you may have nothing to eat or drink 4 hours prior to your CT Scan. Liquids only 4 hours prior to CT Scan.   We have ordered some labs to be drawn today. Please proceed to the [facility] to have these tests completed prior to leaving today. You will check in at the registration desk in the medical mall. Please see walking directions below if needed.  We will call you with the results and next step in plan of care as soon as results are received.   Directions to Medical Mall: When leaving our office, go right. Go all of the way down to the very end of the hallway. You will have a purple wall in front of you. You will now have a tunnel to the hospital on your left hand side. Go through this tunnel and the elevators will be on your left. Go down to the 1st floor and take a slight left. The very first desk on the right hand side is the registration desk.   We have sent over a referral to Hematology and Rheumatology. If you have not heard from them by Tuesday 11/27 please give our office a call.  We will follow back up with you as listed below:

## 2017-10-05 NOTE — Progress Notes (Signed)
10/05/2017  Reason for Visit:  Splenomegaly  Referring Physician:  Domenica Fail, PA-C  History of Present Illness: Terri Long is a 71 y.o. female who presents as a referral for evaluation for splenomegaly.  The patient [place] the country about [place] ago and is accompanied by her son who translates for her.  The patient has had left upper quadrant and epigastric pain for an uncertain amount of time.  She pain is constant but worsens with food intake.  She feels "pulling pain" going from epigastric area radiating to left upper quadrant.  She reports that after walking a short distance, she will have worse pain that sometimes causes shortness of breath.  Denies any nausea or vomiting.  Reports some occasional low abdominal discomfort but cannot describe it well.  Denies any constipation or diarrhea.  Denies any infections either parasites or viral in the past.  Denies taking any NSAIDs.  She was just diagnosed with diabetes by her PCP.    She had a CT scan of the chest which showed only the upper portion of her spleen but was read as probable splenomegaly due to the size that could be seen.  Part of her work up shows labs significant for borderline leukopenia, anemia, as well as thrombocytopenia.  Her ANA has resulted positive as well.  The etiology for this splenomegaly is currently uncertain, but she has been referred for surgical evaluation.   Of note, she reports she had some type of abdominal surgery after delivering her son but uncertain as to what it was.  Past Medical History: Past Medical History:  Diagnosis Date  . Autoimmune disorder ([place]) 08/10/2017  . DM (diabetes mellitus), type 2 ([place]) 09/26/2017     Past Surgical History: Past Surgical History:  Procedure Laterality Date  . ABDOMINAL SURGERY      Home Medications: Prior to Admission medications   Medication Sig Start Date End Date Taking? Authorizing Provider  blood glucose meter kit and supplies KIT Dispense  based on patient and insurance preference. Use up to three times per day. 09/26/17  Yes Clent Demark, PA-C  Carboxymethylcellulose Sodium (THERATEARS) 0.25 % SOLN Apply 1 drop to eye daily. 08/09/17  Yes Clent Demark, PA-C  Glucose Blood (TRUE METRIX BLOOD GLUCOSE TEST VI) USE AS DIRECTED TO TEST BLOOD SUGAR THREE TIMES DAILY 09/26/17  Yes [provider]  metFORMIN (GLUCOPHAGE) 500 MG tablet Take 1 tablet (500 mg total) by mouth 2 (two) times daily with a meal. 08/09/17  Yes Clent Demark, PA-C  TRUEPLUS LANCETS 30G MISC USE AS DIRECTED TO TEST BLOOD SUGAR THREE TIMES DAILY 09/26/17  Yes [provider]  albuterol (PROVENTIL HFA;VENTOLIN HFA) 108 (90 Base) MCG/ACT inhaler Inhale 2 puffs into the lungs every 4 (four) hours as needed for wheezing or shortness of breath. Patient not taking: Reported on 08/09/2017 07/22/17 08/21/17  Clent Demark, PA-C    Allergies: No Known Allergies  Social History:  reports that  has never smoked. she has never used smokeless tobacco. She reports that she does not drink alcohol or use drugs.   Family History: Family History  Problem Relation Age of Onset  . Diabetes Sister     Review of Systems: Review of Systems  Constitutional: Negative for chills and fever.  HENT: Negative for hearing loss.   Respiratory: Positive for shortness of breath.   Cardiovascular: Negative for chest pain.  Gastrointestinal: Positive for abdominal pain. Negative for constipation, diarrhea, nausea and vomiting.  Genitourinary: Negative for dysuria.  Musculoskeletal: Negative for myalgias.  Skin: Negative for rash.  Neurological: Negative for dizziness.  Psychiatric/Behavioral: Negative for depression.  All other systems reviewed and are negative.   Physical Exam BP (!) 171/99   Pulse 82   Temp 97.9 F (36.6 C) (Oral)   Ht 5' (1.524 m)   Wt 69.9 kg (154 lb)   BMI 30.08 kg/m  CONSTITUTIONAL: No acute distress HEENT:   Normocephalic, atraumatic, extraocular motion intact. NECK: Trachea is midline, and there is no jugular venous distension.  RESPIRATORY:  Lungs are clear, and breath sounds are equal bilaterally. Normal respiratory effort without pathologic use of accessory muscles. CARDIOVASCULAR: Heart is regular without murmurs, gallops, or rubs. GI: The abdomen is soft, non-distended, with tenderness to palpation over epigastric and left upper quadrant.  The pain seems to be worse on palpation of epigastric area.  There is palpable splenomegaly. Patient also has a 3 cm reducible umbilical hernia.  She has a vertical low midline incision which was healed well. MUSCULOSKELETAL:  Normal muscle strength and tone in all four extremities.  No peripheral edema or cyanosis. SKIN: Skin turgor is normal. There are no pathologic skin lesions.  NEUROLOGIC:  Motor and sensation is grossly normal.  Cranial nerves are grossly intact. PSYCH:  Alert and oriented to person, place and time. Affect is normal.  Laboratory Analysis: Labs on 9/7 include:  WBC 3.1 and 3.5, Hgb 11, Plt 96.  Na 140, K 4.5, Cl 102, CO2 24, BUN 21, Cr 1.05, Glucose 221.  LFTs within normal.  Positive ANA antibody.  Negative mono, negative Hepatitis panel, negative Tb.  Imaging: CTA Chest 8/14:  No evidence for PE, mild cardiomegaly, bronchial wall thickening compatible with bronchitis, probably splenomegaly though spleen is not imaged in its entirety.  Assessment and Plan: This is a 71 y.o. female who presents with upper abdominal pain and CT chest showing probable splenomegaly.  I have personally viewed the patient's imaging studies and her laboratory studies.  Overall, CT chest unfortunately does not show the entire spleen so it is difficult to assess the size.  Also we lack imaging of the abdomen to show other potential etiology for her pain.  Discussed with the patient that we need to obtain a CT of the abdomen and pelvis with contrast to better  evaluate her spleen size, other etiologies for pain.  Given her anemia and thrombocytopenia with borderline leukopenia, she also needs to be evaluated by Hematology.  She is ANA positive and she also needs Rheumatology referral.  Currently there are many possibilities for her splenomegaly, but it is uncertain that this is the true cause of her pain.  She could have ulcers/gastritis and need only an antiacid.  Suggested to the patient that she start an antiacid as well for at least two weeks to see any effect on her pain.  Furthermore, not all etiologies for splenomegaly require surgery.    Once her workup is complete, if she does require a splenectomy, she may be better off at a [facility] center where they would be better equipped to handle this surgery including the need for platelet transfusion.  She will follow up with [place] in one month to make sure everything is moving forward and that her workup has progressed and if surgery is needed, we can make the referral as well.  The patient understands this plan and all her questions have been answered.  Face-to-face time spent with the patient and care providers was  60 minutes, with more than 50% of the time spent counseling, educating, and coordinating care of the patient.     Melvyn Neth, [place]

## 2017-10-17 ENCOUNTER — Telehealth: Payer: Self-pay | Admitting: Surgery

## 2017-10-17 NOTE — Telephone Encounter (Signed)
I have called patient to follow up on her referral [facility]. Per Dawn, with [facility], she states that she has called patient to make the appointment, however the patient stated she would call back to schedule. I spoke with the patient and she stated that she would have her husband call me to discuss the referral.

## 2017-10-19 ENCOUNTER — Encounter: Payer: Self-pay | Admitting: Oncology

## 2017-10-21 ENCOUNTER — Encounter: Payer: Self-pay | Admitting: Oncology

## 2017-10-21 ENCOUNTER — Ambulatory Visit
Admission: RE | Admit: 2017-10-21 | Discharge: 2017-10-21 | Disposition: A | Discharge status: Home / Self Care | Payer: Self-pay | Source: Ambulatory Visit | Attending: Surgery | Admitting: Surgery

## 2017-10-21 ENCOUNTER — Other Ambulatory Visit: Payer: Self-pay

## 2017-10-21 ENCOUNTER — Inpatient Hospital Stay: Payer: Self-pay

## 2017-10-21 ENCOUNTER — Inpatient Hospital Stay: Payer: Self-pay | Attending: Oncology | Admitting: Oncology

## 2017-10-21 VITALS — BP 127/83 | HR 70 | Temp 96.7°F | Resp 18 | Wt 155.2 lb

## 2017-10-21 DIAGNOSIS — D696 Thrombocytopenia, unspecified: Secondary | ICD-10-CM | POA: Insufficient documentation

## 2017-10-21 DIAGNOSIS — E119 Type 2 diabetes mellitus without complications: Secondary | ICD-10-CM | POA: Insufficient documentation

## 2017-10-21 DIAGNOSIS — R161 Splenomegaly, not elsewhere classified: Secondary | ICD-10-CM | POA: Insufficient documentation

## 2017-10-21 DIAGNOSIS — D649 Anemia, unspecified: Secondary | ICD-10-CM | POA: Insufficient documentation

## 2017-10-21 DIAGNOSIS — K429 Umbilical hernia without obstruction or gangrene: Secondary | ICD-10-CM | POA: Insufficient documentation

## 2017-10-21 DIAGNOSIS — Z7984 Long term (current) use of oral hypoglycemic drugs: Secondary | ICD-10-CM | POA: Insufficient documentation

## 2017-10-21 DIAGNOSIS — Z79899 Other long term (current) drug therapy: Secondary | ICD-10-CM | POA: Insufficient documentation

## 2017-10-21 DIAGNOSIS — K8020 Calculus of gallbladder without cholecystitis without obstruction: Secondary | ICD-10-CM | POA: Insufficient documentation

## 2017-10-21 DIAGNOSIS — R778 Other specified abnormalities of plasma proteins: Secondary | ICD-10-CM | POA: Insufficient documentation

## 2017-10-21 LAB — URINALYSIS, COMPLETE (UACMP) WITH MICROSCOPIC
Bacteria, UA: NONE SEEN
Bilirubin Urine: NEGATIVE
GLUCOSE, UA: NEGATIVE mg/dL
Ketones, ur: NEGATIVE mg/dL
NITRITE: NEGATIVE
PH: 5 (ref 5.0–8.0)
PROTEIN: NEGATIVE mg/dL
SPECIFIC GRAVITY, URINE: 1.017 (ref 1.005–1.030)

## 2017-10-21 LAB — COMPREHENSIVE METABOLIC PANEL
ALK PHOS: 77 U/L (ref 38–126)
ALT: 21 U/L (ref 14–54)
ANION GAP: 9 (ref 5–15)
AST: 30 U/L (ref 15–41)
Albumin: 4.3 g/dL (ref 3.5–5.0)
BILIRUBIN TOTAL: 1.3 mg/dL — AB (ref 0.3–1.2)
BUN: 20 mg/dL (ref 6–20)
CALCIUM: 9.2 mg/dL (ref 8.9–10.3)
CO2: 26 mmol/L (ref 22–32)
CREATININE: 1.04 mg/dL — AB (ref 0.44–1.00)
Chloride: 100 mmol/L — ABNORMAL LOW (ref 101–111)
GFR calc non Af Amer: 53 mL/min — ABNORMAL LOW (ref >60)
GLUCOSE: 125 mg/dL — AB (ref 65–99)
Potassium: 3.8 mmol/L (ref 3.5–5.1)
Sodium: 135 mmol/L (ref 135–145)
TOTAL PROTEIN: 8.8 g/dL — AB (ref 6.5–8.1)

## 2017-10-21 LAB — CBC WITH DIFFERENTIAL/PLATELET
BASOS ABS: 0 10*3/uL (ref 0–0.1)
BASOS PCT: 0 %
EOS ABS: 0.2 10*3/uL (ref 0–0.7)
Eosinophils Relative: 5 %
HEMATOCRIT: 34.6 % — AB (ref 35.0–47.0)
Hemoglobin: 11.7 g/dL — ABNORMAL LOW (ref 12.0–16.0)
Lymphocytes Relative: 39 %
Lymphs Abs: 1.3 10*3/uL (ref 1.0–3.6)
MCH: 29.7 pg (ref 26.0–34.0)
MCHC: 33.7 g/dL (ref 32.0–36.0)
MCV: 88.1 fL (ref 80.0–100.0)
MONO ABS: 0.3 10*3/uL (ref 0.2–0.9)
Monocytes Relative: 8 %
NEUTROS ABS: 1.6 10*3/uL (ref 1.4–6.5)
NEUTROS PCT: 48 %
Platelets: 95 10*3/uL — ABNORMAL LOW (ref 150–440)
RBC: 3.93 MIL/uL (ref 3.80–5.20)
RDW: 13.9 % (ref 11.5–14.5)
WBC: 3.2 10*3/uL — ABNORMAL LOW (ref 3.6–11.0)

## 2017-10-21 LAB — IRON AND TIBC
Iron: 78 ug/dL (ref 28–170)
SATURATION RATIOS: 26 % (ref 10.4–31.8)
TIBC: 298 ug/dL (ref 250–450)
UIBC: 220 ug/dL

## 2017-10-21 LAB — TSH: TSH: 2.965 u[IU]/mL (ref 0.350–4.500)

## 2017-10-21 LAB — FERRITIN: Ferritin: 58 ng/mL (ref 11–307)

## 2017-10-21 LAB — LACTATE DEHYDROGENASE: LDH: 213 U/L — AB (ref 98–192)

## 2017-10-21 LAB — FOLATE: Folate: 18.7 ng/mL (ref >5.9)

## 2017-10-21 LAB — VITAMIN B12: Vitamin B-12: 525 pg/mL (ref 180–914)

## 2017-10-21 MED ORDER — IOPAMIDOL (ISOVUE-300) INJECTION 61%
100.0000 mL | Freq: Once | INTRAVENOUS | Status: AC | PRN
  Administered 2017-10-21: 100 mL via INTRAVENOUS

## 2017-10-21 NOTE — Progress Notes (Signed)
Patient here today as a new patient  

## 2017-10-21 NOTE — Progress Notes (Signed)
Hematology/Oncology Consult note [facility] Telephone:([phone] [phone]   Patient Care Team: Tawny Asal as PCP - General (Physician Assistant)  REFERRING PROVIDER: Surgery: Dr.Piscoya   CHIEF COMPLAINTS/PURPOSE OF CONSULTATION:  Evaluation of Splenomegaly.   HISTORY OF PRESENTING ILLNESS:  Terri Long is a  71 y.o.  female with PMH listed below who was referred to me for evaluation of splenomegaly. Patient is accompanied by her son. Patient speaks [place] language and a son helped translate. [place] does not have interpreter available at this point. Patient's son provided majority of the information. Patient came to this country about 3-4 months ago. She has a chronic left upper quadrant for this and the pain. Patient follows up with primary care physician which discovered splenomegaly and referred to surgeon. Surgeon referred to hematology for further additional workup. Purse on patient is not very active, lives with son. No report of shortness of breath, chest pain, night sweats, fever or chills. Appetite is fair. Denies any alcohol or cigarette use  Review of Systems  Constitutional: Positive for malaise/fatigue. Negative for chills and fever.  HENT: Negative for hearing loss.   Eyes: Negative for blurred vision.  Cardiovascular: Negative for chest pain.  Gastrointestinal: Positive for abdominal pain. Negative for diarrhea and heartburn.  Genitourinary: Negative for dysuria.  Musculoskeletal: Negative for myalgias.  Skin: Negative for rash.  Neurological: Negative for dizziness.  Endo/Heme/Allergies: Does not bruise/bleed easily.  Psychiatric/Behavioral: Negative for depression.    MEDICAL HISTORY:  Past Medical History:  Diagnosis Date  . Autoimmune disorder ([place]) 08/10/2017  . DM (diabetes mellitus), type 2 ([place]) 09/26/2017    SURGICAL HISTORY: Past Surgical History:  Procedure Laterality Date  . ABDOMINAL  SURGERY      SOCIAL HISTORY: Social History   Socioeconomic History  . Marital status: Widowed    Spouse name: Not on file  . Number of children: Not on file  . Years of education: Not on file  . Highest education level: Not on file  Social Needs  . Financial resource strain: Not on file  . Food insecurity - worry: Not on file  . Food insecurity - inability: Not on file  . Transportation needs - medical: Not on file  . Transportation needs - non-medical: Not on file  Occupational History  . Not on file  Tobacco Use  . Smoking status: Never Smoker  . Smokeless tobacco: Never Used  Substance and Sexual Activity  . Alcohol use: No  . Drug use: No  . Sexual activity: Not on file  Other Topics Concern  . Not on file  Social History Narrative  . Not on file    FAMILY HISTORY: Family History  Problem Relation Age of Onset  . Diabetes Sister     ALLERGIES:  has No Known Allergies.  MEDICATIONS:  Current Outpatient Medications  Medication Sig Dispense Refill  . Carboxymethylcellulose Sodium (THERATEARS) 0.25 % SOLN Apply 1 drop to eye daily. 1 each 0  . metFORMIN (GLUCOPHAGE) 500 MG tablet Take 1 tablet (500 mg total) by mouth 2 (two) times daily with a meal. 60 tablet 5  . blood glucose meter kit and supplies KIT Dispense based on patient and insurance preference. Use up to three times per day. 1 each 0  . Glucose Blood (TRUE METRIX BLOOD GLUCOSE TEST VI) USE AS DIRECTED TO TEST BLOOD SUGAR THREE TIMES DAILY  0  . TRUEPLUS LANCETS 30G MISC USE AS DIRECTED TO TEST BLOOD SUGAR THREE TIMES  DAILY  0   No current facility-administered medications for this visit.      PHYSICAL EXAMINATION: ECOG PERFORMANCE STATUS: 1 - Symptomatic but completely ambulatory Vitals:   10/21/17 0930  BP: 127/83  Pulse: 70  Resp: 18  Temp: (!) 96.7 F (35.9 C)   Filed Weights   10/21/17 0930  Weight: 155 lb 3 oz (70.4 kg)    Physical Exam  Constitutional: She is oriented to person,  place, and time and well-developed, well-nourished, and in no distress.  HENT:  Head: Normocephalic and atraumatic.  Eyes: Conjunctivae and EOM are normal. Pupils are equal, round, and reactive to light. Left eye exhibits no discharge.  Neck: Normal range of motion. Neck supple.  Cardiovascular: Normal rate, regular rhythm and normal heart sounds.  Pulmonary/Chest: Effort normal and breath sounds normal.  Abdominal: Soft. Bowel sounds are normal.  Splenomegaly  Musculoskeletal: Normal range of motion. She exhibits no edema.  Lymphadenopathy:    She has no cervical adenopathy.  Neurological: She is alert and oriented to person, place, and time. She displays normal reflexes. Gait normal.  Skin: Skin is warm and dry. She is not diaphoretic.  Psychiatric: Affect normal.     LABORATORY DATA:  I have reviewed the data as listed CBC Latest Ref Rng & Units 10/21/2017 07/22/2017 07/22/2017  WBC 3.6 - 11.0 K/uL 3.2(L) 3.5 3.1(L)  Hemoglobin 12.0 - 16.0 g/dL 11.7(L) 11.0(L) 10.9(L)  Hematocrit 35.0 - 47.0 % 34.6(L) 33.4(L) 33.4(L)  Platelets 150 - 440 K/uL 95(L) 96(LL) 100(LL)   Recent Labs    06/28/17 0940 07/22/17 1037 10/21/17 1010  NA 136 140 135  K 4.7 4.5 3.8  CL 105 102 100*  CO2 25 24 26   GLUCOSE 169* 221* 125*  BUN 21* 21 20  CREATININE 0.98 1.05* 1.04*  CALCIUM 9.2 9.8 9.2  GFRNONAA 57* 54* 53*  GFRAA >60 62 >60  PROT  --  8.9* 8.8*  ALBUMIN  --  4.0 4.3  AST  --  22 30  ALT  --  14 21  ALKPHOS  --  81 77  BILITOT  --  0.7 1.3*    RADIOGRAPHIC STUDIES: I have personally reviewed the radiological images as listed and agreed with the findings in the report. Patient had CT chest done on 06/28/2017 showed no evidence of pulmonary embolism. Mild cardiomegaly. Bronchial wall thickening compatible with bronchitis. Probable splenomegaly explaining is not fully seeing on CT chest. Patient also has CT abdomen and pelvis with contrast on 10/21/2017 which showed splenomegaly, splenic  size 14.6 cm. Cholelithiasis without inflammation, moderate size fact contained. Umbilical hernia. No other abnormalities seen abdomen or pelvis.   ASSESSMENT & PLAN:  1. Thrombocytopenia (HCC)   2. Splenomegaly   3. Anemia, unspecified type   4. Elevated total protein    Lab results and image results discussed with patient. We'll start with basic workup including CBC, CMP, UA, LDH, flow cytometry, protein electrophoresis and free light chain ratio, folic, G28, TSH, iron panel, HIV, BCR ABL fish, Barnabas Lister 2 with reflex.  Also, discussed with the patient that if no clear etiology found- bone marrow biopsy would be suggested. Currently await for the above workup.   # Patient follow-up with me in approximately 7 days to review the above results.  All questions were answered. The patient knows to call the clinic with any problems questions or concerns.  Return of visit: one week to discuss results and management plan.  Thank you for this kind referral  and the opportunity to participate in the care of this patient. A copy of today's note is routed to referring provider    Earlie Server, MD, PhD Hematology Oncology [facility] Pager- [phone] 10/21/2017

## 2017-10-22 LAB — HEPATITIS PANEL, ACUTE
HCV Ab: <0.1 s/co ratio (ref 0.0–0.9)
Hep A IgM: NEGATIVE
Hep B C IgM: NEGATIVE
Hepatitis B Surface Ag: NEGATIVE

## 2017-10-22 LAB — HIV ANTIBODY (ROUTINE TESTING W REFLEX): HIV Screen 4th Generation wRfx: NONREACTIVE

## 2017-10-22 LAB — RHEUMATOID FACTOR: RHEUMATOID FACTOR: 18.1 [IU]/mL — AB (ref 0.0–13.9)

## 2017-10-24 ENCOUNTER — Ambulatory Visit (INDEPENDENT_AMBULATORY_CARE_PROVIDER_SITE_OTHER): Payer: Self-pay | Admitting: Physician Assistant

## 2017-10-24 LAB — PROTEIN ELECTROPHORESIS, SERUM
A/G Ratio: 0.7 (ref 0.7–1.7)
ALPHA-2-GLOBULIN: 0.5 g/dL (ref 0.4–1.0)
Albumin ELP: 3.6 g/dL (ref 2.9–4.4)
Alpha-1-Globulin: 0.2 g/dL (ref 0.0–0.4)
BETA GLOBULIN: 1.3 g/dL (ref 0.7–1.3)
Gamma Globulin: 3 g/dL — ABNORMAL HIGH (ref 0.4–1.8)
Globulin, Total: 5.1 g/dL — ABNORMAL HIGH (ref 2.2–3.9)
Total Protein ELP: 8.7 g/dL — ABNORMAL HIGH (ref 6.0–8.5)

## 2017-10-24 LAB — KAPPA/LAMBDA LIGHT CHAINS
KAPPA FREE LGHT CHN: 54.5 mg/L — AB (ref 3.3–19.4)
KAPPA, LAMDA LIGHT CHAIN RATIO: 1.55 (ref 0.26–1.65)
LAMDA FREE LIGHT CHAINS: 35.1 mg/L — AB (ref 5.7–26.3)

## 2017-10-25 LAB — COMP PANEL: LEUKEMIA/LYMPHOMA

## 2017-10-25 NOTE — Telephone Encounter (Signed)
I have called patient again to see if her husband had called to schedule her appointment with Rheumatology. She stated that she thought he did, however [facility], she stated that the husband was to call her back. I have advised the patient to make the appointment prior to seeing Dr Aleen CampiPiscoya.   Patient did see Dr Cathie HoopsYu on 10/21/17 for evaluation of splenomegaly.   Patient has completed her CT scan of abd and pelvis.

## 2017-10-27 ENCOUNTER — Encounter (INDEPENDENT_AMBULATORY_CARE_PROVIDER_SITE_OTHER): Payer: Self-pay | Admitting: Physician Assistant

## 2017-10-27 ENCOUNTER — Ambulatory Visit (INDEPENDENT_AMBULATORY_CARE_PROVIDER_SITE_OTHER): Payer: Self-pay | Admitting: Physician Assistant

## 2017-10-27 ENCOUNTER — Other Ambulatory Visit: Payer: Self-pay

## 2017-10-27 VITALS — BP 153/94 | HR 74 | Temp 98.3°F | Wt 155.2 lb

## 2017-10-27 DIAGNOSIS — R161 Splenomegaly, not elsewhere classified: Secondary | ICD-10-CM

## 2017-10-27 DIAGNOSIS — R1012 Left upper quadrant pain: Secondary | ICD-10-CM

## 2017-10-27 MED ORDER — ACETAMINOPHEN-CODEINE #3 300-30 MG PO TABS: 1.0000 | ORAL_TABLET | Freq: Two times a day (BID) | ORAL | 0 refills | Status: AC | PRN

## 2017-10-27 NOTE — Progress Notes (Signed)
Subjective:  Patient ID: Terri Long, female    DOB: November 20, 1945  Age: 71 y.o. MRN: [id number]  CC: f/u splenomegaly  HPI Terri Long is a 71 y.o. female with a medical history of DM2 and autoimmune disease presents to f/u on splenomegaly. She was referred to surgery by me but surgery deferred and asked for patient to see hematology first. Pt went to hematology and had multiple labs drawn which reveal positive kappa and lamda free light chains. No observable M spike. Patient continues to have epigastric pain and LUQ discomfort. Denies CP, palpitations, SOB, HA, rash, f/c/n/v, edema, fatigue, weight loss, night sweats, arthralgias, myalgias, or GI/GU sxs. Has appointment with oncologist tomorrow at 1415 hrs.        Outpatient Medications Prior to Visit  Medication Sig Dispense Refill  . blood glucose meter kit and supplies KIT Dispense based on patient and insurance preference. Use up to three times per day. 1 each 0  . Carboxymethylcellulose Sodium (THERATEARS) 0.25 % SOLN Apply 1 drop to eye daily. 1 each 0  . Glucose Blood (TRUE METRIX BLOOD GLUCOSE TEST VI) USE AS DIRECTED TO TEST BLOOD SUGAR THREE TIMES DAILY  0  . metFORMIN (GLUCOPHAGE) 500 MG tablet Take 1 tablet (500 mg total) by mouth 2 (two) times daily with a meal. 60 tablet 5  . TRUEPLUS LANCETS 30G MISC USE AS DIRECTED TO TEST BLOOD SUGAR THREE TIMES DAILY  0   No facility-administered medications prior to visit.      ROS Review of Systems  Constitutional: Negative for chills, fever and malaise/fatigue.  Eyes: Negative for blurred vision.  Respiratory: Negative for shortness of breath.   Cardiovascular: Negative for chest pain and palpitations.  Gastrointestinal: Positive for abdominal pain. Negative for nausea.  Genitourinary: Negative for dysuria and hematuria.  Musculoskeletal: Negative for joint pain and myalgias.  Skin: Negative for rash.  Neurological: Negative for tingling and headaches.   Psychiatric/Behavioral: Negative for depression. The patient is not nervous/anxious.     Objective:  Wt 155 lb 3.2 oz (70.4 kg)   BMI 30.31 kg/m   BP/Weight 10/27/2017 10/21/2017 19/37/9024  Systolic BP - 097 353  Diastolic BP - 83 99  Wt. (Lbs) 155.2 155.19 154  BMI 30.31 30.31 30.08      Physical Exam  Constitutional: She is oriented to person, place, and time.  Well developed, well nourished, NAD, polite  HENT:  Head: Normocephalic and atraumatic.  Eyes: Conjunctivae are normal. No scleral icterus.  Neck: Normal range of motion. Neck supple. No thyromegaly present.  Cardiovascular: Normal rate, regular rhythm and normal heart sounds.  Pulmonary/Chest: Effort normal and breath sounds normal. No respiratory distress. She has no wheezes.  Abdominal: Soft. Bowel sounds are normal. She exhibits no distension. There is tenderness (mild TTP in the RUQ and LUQ). There is no guarding.  Mild splenomegaly  Musculoskeletal: She exhibits no edema.  Neurological: She is alert and oriented to person, place, and time. No cranial nerve deficit. Coordination normal.  Skin: Skin is warm and dry. No rash noted. No erythema. No pallor.  Psychiatric: She has a normal mood and affect. Her behavior is normal. Thought content normal.  Vitals reviewed.    Assessment & Plan:   1. Splenomegaly - Kappa and Lambda positive. M spike not observed. Patient to see oncologist tomorrow.    2. Abdominal pain, left upper quadrant - Begin Tylenol #3 - Keep appointment with oncologist tomorrow.    Meds ordered this encounter  Medications  . acetaminophen-codeine (TYLENOL #3) 300-30 MG tablet    Sig: Take 1 tablet by mouth every 12 (twelve) hours as needed for moderate pain.    Dispense:  30 tablet    Refill:  0    Order Specific Question:   Supervising Provider    Answer:   Tresa Garter [id number]    Follow-up: Return in about 6 weeks (around 12/08/2017) for diabetes.   Clent Demark  PA

## 2017-10-27 NOTE — Patient Instructions (Signed)
Multiple Myeloma  Multiple myeloma is a form of cancer that results from the uncontrolled growth of abnormal plasma cells. Plasma cells are a type of white blood cell produced in the soft tissue inside bones (bone marrow). These are cells in your blood that normally help you fight infection. They are part of your body's defense system (immune system). Plasma cells that become cancerous will grow out of control. As a result, they interfere with normal blood cells and many important functions that normal cells perform in your body. With multiple myeloma, the abnormal plasma cells cause multiple tumors to form.  Multiple myeloma damages your bones and causes other health problems because of its effect on blood cells. The disease progresses and reduces your ability to fight off infections.  What are the causes?  The cause of multiple myeloma is not known.  What increases the risk?  Risk factors include:   Being older than 65.   Being African American.   Having a family history of the disease.    What are the signs or symptoms?  Signs and symptoms of multiple myeloma may include:   Bone pain, especially in the back, ribs, and hips.   Broken bones (fractures).   Low blood counts, including reduced red blood cells (anemia), reduced white blood cells (leukopenia), and reduced platelets.   Fatigue.   Weakness.   Infections.   Bleeding, such as bleeding from the nose or gums, or increased bleeding from a scrape or cut.   High blood calcium levels.   Increased urination.   Confusion.    How is this diagnosed?  Your health care provider will do a physical exam and take your medical history. Tests will be done to help confirm the diagnosis. Tests may include:   Blood tests.   Urine tests.   X-rays.   MRI.   Bone marrow biopsy. In this test, a sample of marrow is removed from one of your bones. The sample is viewed under a microscope to check for abnormal plasma cells.    How is this treated?  There is no cure  for multiple myeloma. Treatment options may vary depending on how much the disease has advanced. Possible treatment options may include:   Medicines that kill cancer cells (chemotherapy).   Medicines that help prevent bone damage (bisphosphonates).   Radiation therapy. High-energy rays are used to kill cancer cells.   Surgery. This may be done to repair damage to bone.   Targeted drug therapy. These medicines block the growth and spread of cancer cells.   Immunotherapy. This is also called biologic therapy. It involves the use of medicines to strengthen the ability of your immune system to fight cancer cells.   Stem cell transplant. Healthy stem cells are infused into your body. These stem cells produce new blood cells to replace those killed by the disease or by other treatments. The healthy cells that are transplanted may be your own or may come from another person.   Plasmapheresis. This is a procedure used to remove plasma cells from your blood.   Other medicines to treat problems such as infections or pain.    Follow these instructions at home:   Take medicines only as directed by your health care provider.   Drink enough fluid to keep your urine clear or pale yellow.   Eat a well-balanced diet. Work with a dietitian to make sure you are getting the nutrition you need.   Take vitamins or dietary supplements as directed   a support group or seeking counseling to help you cope with the stress of having multiple myeloma.  Keep all follow-up visits as directed by your health care provider. This is important. Contact a health care provider if:  Your pain is not controlled with medicine or is getting worse.  You have a  fever.  You have swollen legs.  You have weakness or dizziness.  You have unexplained weight loss.  You have unexplained bleeding or bruising.  You have a cough or cold symptoms.  You feel depressed.  You have changes in urination or bowel movements. Get help right away if:  You have sudden severe pain, especially back pain.  You have numbness or weakness in your arms, hands, legs, or feet.  You have confusion.  You have weakness on one side of your body.  You have slurred speech.  You have trouble staying awake.  You have shortness of breath.  You have blood in your stool or urine.  You vomit or cough up blood. This information is not intended to replace advice given to you by your health care provider. Make sure you discuss any questions you have with your health care provider. Document Released: 07/27/2001 Document Revised: 04/08/2016 Document Reviewed: 06/04/2014 Elsevier Interactive Patient Education  [employer].

## 2017-10-28 ENCOUNTER — Telehealth: Payer: Self-pay | Admitting: Oncology

## 2017-10-28 ENCOUNTER — Inpatient Hospital Stay: Payer: Self-pay | Admitting: Oncology

## 2017-10-28 NOTE — Telephone Encounter (Signed)
Patient's son Mathis Farelbert called that requests call back to talk about lab results.  Attempted to call patient's son, phone line was connected and then disconnected.

## 2017-10-31 MED FILL — ?METFORMIN HCL 500MG TABLET: 500 | 30 days supply | Qty: 60 | Fill #1

## 2017-11-02 LAB — BCR-ABL1 FISH
CELLS ANALYZED: 200
CELLS COUNTED: 200
PDF: 0

## 2017-11-07 LAB — JAK2 V617F, W REFLEX TO CALR/E12/MPL

## 2017-11-07 LAB — CALR + JAK2 E12-15 + MPL (REFLEXED)

## 2017-11-10 ENCOUNTER — Ambulatory Visit (INDEPENDENT_AMBULATORY_CARE_PROVIDER_SITE_OTHER): Payer: Self-pay | Admitting: Physician Assistant

## 2017-11-17 ENCOUNTER — Inpatient Hospital Stay: Payer: Self-pay | Attending: Oncology | Admitting: Oncology

## 2017-11-17 ENCOUNTER — Encounter: Payer: Self-pay | Admitting: Oncology

## 2017-11-17 VITALS — BP 153/93 | HR 81 | Temp 98.4°F | Resp 18 | Wt 156.5 lb

## 2017-11-17 DIAGNOSIS — Z79899 Other long term (current) drug therapy: Secondary | ICD-10-CM | POA: Insufficient documentation

## 2017-11-17 DIAGNOSIS — E119 Type 2 diabetes mellitus without complications: Secondary | ICD-10-CM | POA: Insufficient documentation

## 2017-11-17 DIAGNOSIS — R161 Splenomegaly, not elsewhere classified: Secondary | ICD-10-CM | POA: Insufficient documentation

## 2017-11-17 DIAGNOSIS — D72819 Decreased white blood cell count, unspecified: Secondary | ICD-10-CM | POA: Insufficient documentation

## 2017-11-17 DIAGNOSIS — D696 Thrombocytopenia, unspecified: Secondary | ICD-10-CM | POA: Insufficient documentation

## 2017-11-17 DIAGNOSIS — Z7984 Long term (current) use of oral hypoglycemic drugs: Secondary | ICD-10-CM | POA: Insufficient documentation

## 2017-11-17 NOTE — Progress Notes (Signed)
Hematology/Oncology Consult note [facility] [phone] [phone]   Patient Care Team: Tawny Asal as PCP - General (Physician Assistant)  REFERRING PROVIDER: Surgery: Dr.Piscoya   CHIEF COMPLAINTS/PURPOSE OF CONSULTATION:  Evaluation of Splenomegaly.   HISTORY OF PRESENTING ILLNESS:  Terri Long is a  72 y.o.  female with PMH listed below who was referred to me for evaluation of splenomegaly. Patient is accompanied by her son. Patient speaks [place] language and a son helped translate. [place] does not have interpreter available at this point. Patient's son provided majority of the information. Patient came to this country about 3-4 months ago. She has a chronic left upper quadrant for this and the pain. Patient follows up with primary care physician which discovered splenomegaly and referred to surgeon. Surgeon referred to hematology for further additional workup. Purse on patient is not very active, lives with son. No report of shortness of breath, chest pain, night sweats, fever or chills. Appetite is fair. Denies any alcohol or cigarette use  INTERVAL HISTORY Terri Long is a 72 y.o. female who has above history reviewed by me today presents for follow up visit for management of   Thrombocytopenia and splenomegaly. Our  Interpreter service at the hospital cannot translate the language patient speaks. Patient has signed a waiver and authorized his son due to the translation.  Per patient's son, patient's feeling the same. She has some fatigue which is chronic. Patient doesn't have insurance She was referred to see a rheumatologist surgeon but patient's son did not make the appointment because of   No insurance and patient does not want to sign paperwork to be patient's financial guarantor. Patient does not have much of abdominal pain today.Denies shortness of breath or chest pain.  Review of Systems  Constitutional:  Positive for malaise/fatigue. Negative for chills and fever.  HENT: Negative for hearing loss.   Eyes: Negative for blurred vision.  Cardiovascular: Negative for chest pain.  Gastrointestinal: Negative for abdominal pain, diarrhea and heartburn.  Genitourinary: Negative for dysuria.  Musculoskeletal: Negative for myalgias.  Skin: Negative for rash.  Neurological: Negative for dizziness.  Endo/Heme/Allergies: Does not bruise/bleed easily.  Psychiatric/Behavioral: Negative for depression.    MEDICAL HISTORY:  Past Medical History:  Diagnosis Date  . Autoimmune disorder ([place]) 08/10/2017  . DM (diabetes mellitus), type 2 ([place]) 09/26/2017    SURGICAL HISTORY: Past Surgical History:  Procedure Laterality Date  . ABDOMINAL SURGERY      SOCIAL HISTORY: Social History   Socioeconomic History  . Marital status: Widowed    Spouse name: Not on file  . Number of children: Not on file  . Years of education: Not on file  . Highest education level: Not on file  Social Needs  . Financial resource strain: Not on file  . Food insecurity - worry: Not on file  . Food insecurity - inability: Not on file  . Transportation needs - medical: Not on file  . Transportation needs - non-medical: Not on file  Occupational History  . Not on file  Tobacco Use  . Smoking status: Never Smoker  . Smokeless tobacco: Never Used  Substance and Sexual Activity  . Alcohol use: No  . Drug use: No  . Sexual activity: Not on file  Other Topics Concern  . Not on file  Social History Narrative  . Not on file    FAMILY HISTORY: Family History  Problem Relation Age of Onset  . Diabetes Sister  ALLERGIES:  has No Known Allergies.  MEDICATIONS:  Current Outpatient Medications  Medication Sig Dispense Refill  . acetaminophen-codeine (TYLENOL #3) 300-30 MG tablet Take 1 tablet by mouth every 12 (twelve) hours as needed for moderate pain. 30 tablet 0  . blood glucose meter kit and supplies KIT  Dispense based on patient and insurance preference. Use up to three times per day. 1 each 0  . Carboxymethylcellulose Sodium (THERATEARS) 0.25 % SOLN Apply 1 drop to eye daily. 1 each 0  . Glucose Blood (TRUE METRIX BLOOD GLUCOSE TEST VI) USE AS DIRECTED TO TEST BLOOD SUGAR THREE TIMES DAILY  0  . metFORMIN (GLUCOPHAGE) 500 MG tablet Take 1 tablet (500 mg total) by mouth 2 (two) times daily with a meal. 60 tablet 5  . TRUEPLUS LANCETS 30G MISC USE AS DIRECTED TO TEST BLOOD SUGAR THREE TIMES DAILY  0   No current facility-administered medications for this visit.      PHYSICAL EXAMINATION: ECOG PERFORMANCE STATUS: 1 - Symptomatic but completely ambulatory Vitals:   11/17/17 1500  BP: (!) 153/93  Pulse: 81  Resp: 18  Temp: 98.4 F (36.9 C)   Filed Weights   11/17/17 1500  Weight: 156 lb 8 oz (71 kg)    Physical Exam  Constitutional: She is oriented to person, place, and time and well-developed, well-nourished, and in no distress.  HENT:  Head: Normocephalic and atraumatic.  Eyes: Conjunctivae and EOM are normal. Pupils are equal, round, and reactive to light. Left eye exhibits no discharge.  Neck: Normal range of motion. Neck supple.  Cardiovascular: Normal rate, regular rhythm and normal heart sounds.  Pulmonary/Chest: Effort normal and breath sounds normal.  Abdominal: Soft. Bowel sounds are normal.  Splenomegaly  Musculoskeletal: Normal range of motion. She exhibits no edema.  Lymphadenopathy:    She has no cervical adenopathy.  Neurological: She is alert and oriented to person, place, and time. She displays normal reflexes. Gait normal.  Skin: Skin is warm and dry. She is not diaphoretic.  Psychiatric: Affect normal.     LABORATORY DATA:  I have reviewed the data as listed CBC Latest Ref Rng & Units 10/21/2017 07/22/2017 07/22/2017  WBC 3.6 - 11.0 K/uL 3.2(L) 3.5 3.1(L)  Hemoglobin 12.0 - 16.0 g/dL 11.7(L) 11.0(L) 10.9(L)  Hematocrit 35.0 - 47.0 % 34.6(L) 33.4(L) 33.4(L)    Platelets 150 - 440 K/uL 95(L) 96(LL) 100(LL)   Recent Labs    06/28/17 0940 07/22/17 1037 10/21/17 1010  NA 136 140 135  K 4.7 4.5 3.8  CL 105 102 100*  CO2 _0 GLUCOSE 169* 221* 125*  BUN 21* 21 20  CREATININE 0.98 1.05* 1.04*  CALCIUM 9.2 9.8 9.2  GFRNONAA 57* 54* 53*  GFRAA >60 62 >60  PROT  --  8.9* 8.8*  ALBUMIN  --  4.0 4.3  AST  --  22 30  ALT  --  14 21  ALKPHOS  --  81 77  BILITOT  --  0.7 1.3*    RADIOGRAPHIC STUDIES: I have personally reviewed the radiological images as listed and agreed with the findings in the report. Patient had CT chest done on 06/28/2017 showed no evidence of pulmonary embolism. Mild cardiomegaly. Bronchial wall thickening compatible with bronchitis. Probable splenomegaly explaining is not fully seeing on CT chest. Patient also has CT abdomen and pelvis with contrast on 10/21/2017 which showed splenomegaly, splenic size 14.6 cm. Cholelithiasis without inflammation, moderate size fact contained. Umbilical hernia. No other abnormalities seen  abdomen or pelvis.   ASSESSMENT & PLAN:  1. Thrombocytopenia ([place])    Lab results and image results discussed with patient  and his son. Patient basic workup revealed persistent thrombocytopenia,  And mild leukopenia with normal differential, mild anemia.  Normal folate, B12, TSH, hepatitis negative, negative BCR ABL fish, negative Jak 2 with reflex  I suspect she may have underlying bone marrow disorder such as MDS or the thrombocytopenia can be secondary to hypersplenism,  Or due to autoimmune disease. I discussed with patient's son that I think it's reasonable for the patient to make an appointment to see rheumatologist. Patient's son  He is not willing to make  Appointment for his mother. Discussed with patient's son and patient that a bone marrow biopsy will be recommended to further investigate bone marrow. He is also concerned his financial responsibility if a bone marrow biopsy to be done..   discussed with patient and his son that workup will be limited due to lack of insurance and financial problem. I suggest close watch her counts in 3 months and then decide at that point if a bone marrow biopsy is absolutely needed. Currently patient's counts appear stable at her chronic baseline and she doesn't have any active bleeding.  Patient's son and patient agreed with The plan  Return of visit:  3 weeks with repeat CBC  Earlie Server, MD, PhD Hematology Oncology [facility] at [facility] Pager- [phone] 11/17/2017

## 2017-11-21 ENCOUNTER — Ambulatory Visit: Payer: Self-pay | Admitting: Surgery

## 2017-11-22 ENCOUNTER — Telehealth: Payer: Self-pay | Admitting: Surgery

## 2017-11-22 NOTE — Telephone Encounter (Signed)
Unable to leave a message for the patient to call the office to r/s her no showed appointment on  11/21/17. If the patient calls back. Please r/s missed appointment.

## 2017-11-24 NOTE — Telephone Encounter (Signed)
Spoke with the patient said they did not want to r.s appointment at this time and would call back later on if they decide to r/s.

## 2017-12-06 MED FILL — ?METFORMIN HCL 500MG TABLET: 500 | 30 days supply | Qty: 60 | Fill #2

## 2017-12-08 ENCOUNTER — Ambulatory Visit (INDEPENDENT_AMBULATORY_CARE_PROVIDER_SITE_OTHER): Payer: Self-pay | Admitting: Physician Assistant

## 2018-01-18 MED FILL — ?METFORMIN HCL 500MG TABLET: 500 | 30 days supply | Qty: 60 | Fill #3

## 2018-02-15 ENCOUNTER — Inpatient Hospital Stay: Payer: Self-pay | Attending: Oncology

## 2018-02-15 ENCOUNTER — Inpatient Hospital Stay: Payer: Self-pay | Admitting: Oncology

## 2018-03-14 MED FILL — metFORMIN HCL 500 MG TABS: 500 | 30 days supply | Qty: 60 | Fill #4

## 2018-06-20 IMAGING — CT CT ABD-PELV W/ CM
2 of 5 series · 16 of 46 positions shown, 18 images · IV contrast (APPLIED)
Comparison: None.

CLINICAL DATA: Left upper quadrant and epigastric abdominal pain.

EXAM:
CT ABDOMEN AND PELVIS WITH CONTRAST
TECHNIQUE: Multidetector CT imaging of the abdomen and pelvis was performed
using the standard protocol following bolus administration of
intravenous contrast.
CONTRAST:  100mL ZSWHX7-5PP IOPAMIDOL (ZSWHX7-5PP) INJECTION 61%

[Series 2: routine abd/pel with · axial · 0.65mm/px · z∈[-416,-71]mm · 13 of 79 slices shown, 15 images]
[im 5/79  soft-tissue]
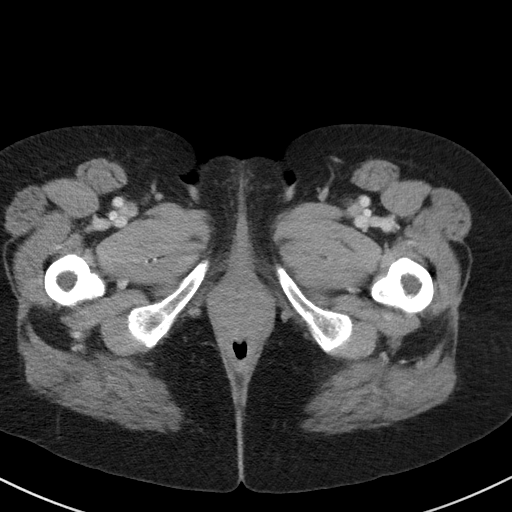
[im 5/79  bone]
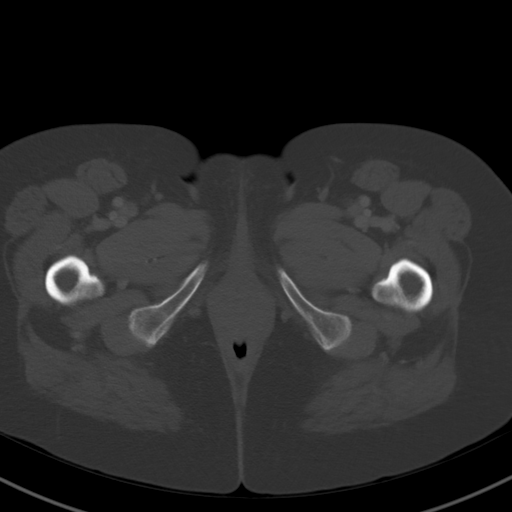
[im 9/79  soft-tissue]
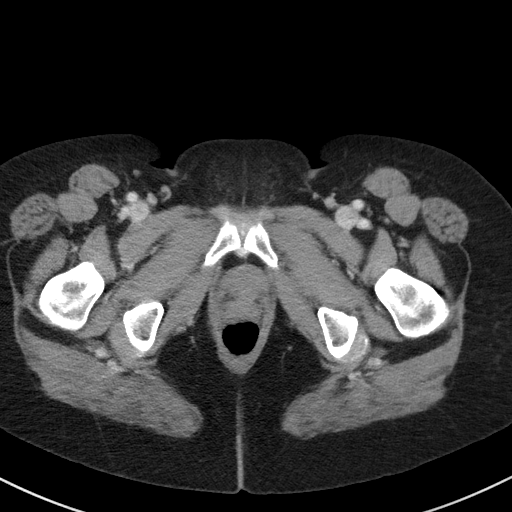
[im 18/79  soft-tissue]
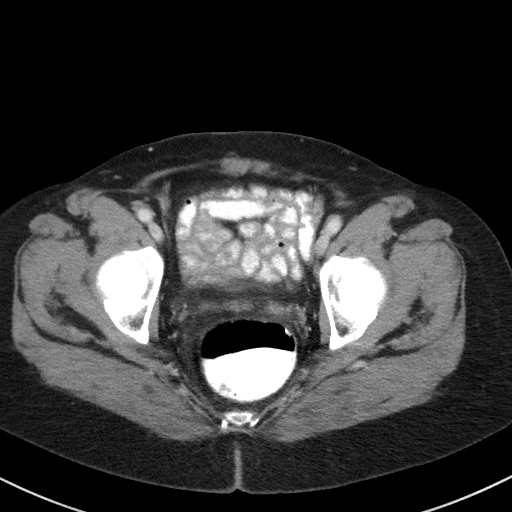
[im 22/79  soft-tissue]
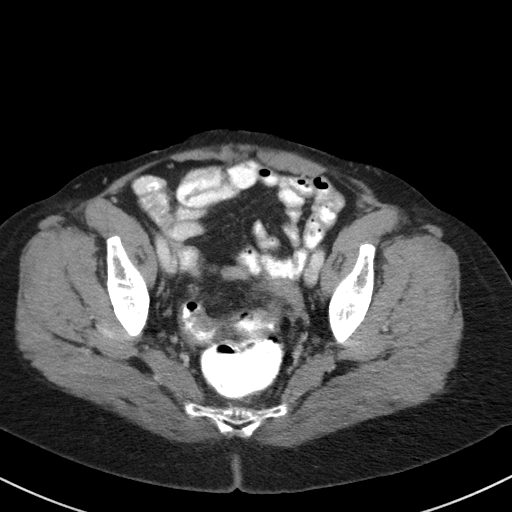
[im 27/79  soft-tissue]
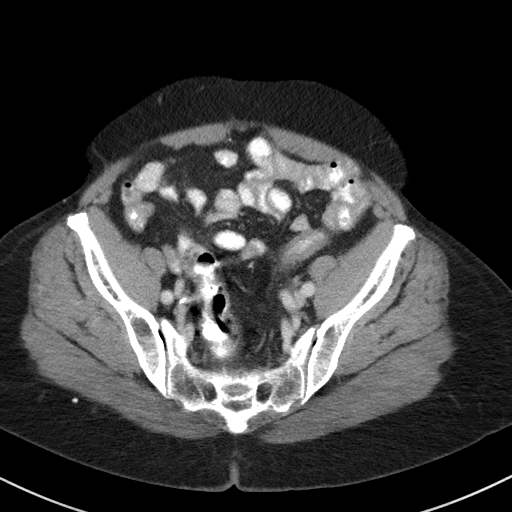
[im 35/79  soft-tissue]
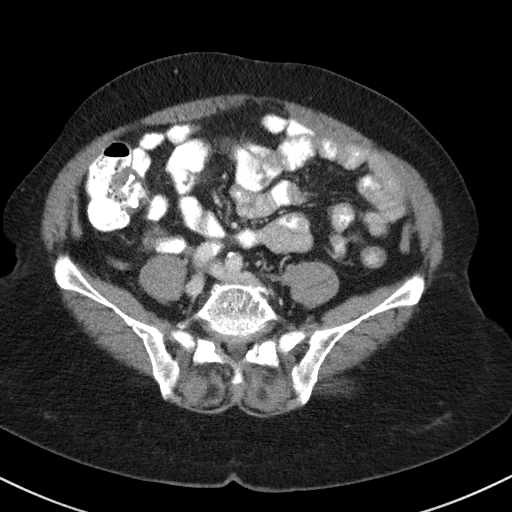
[im 40/79  soft-tissue]
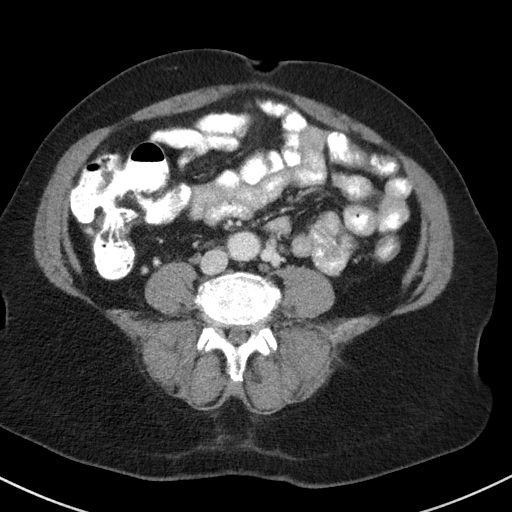
[im 44/79  soft-tissue]
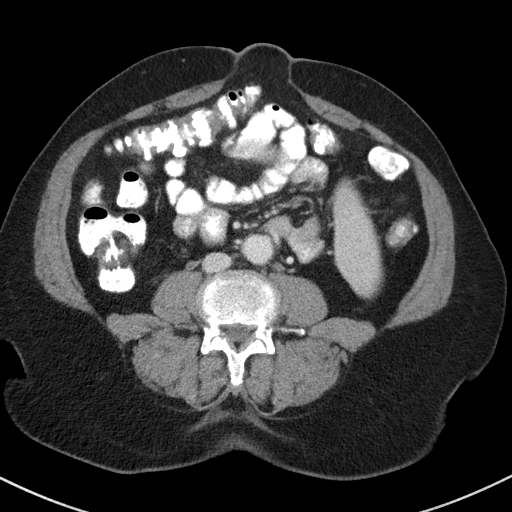
[im 53/79  soft-tissue]
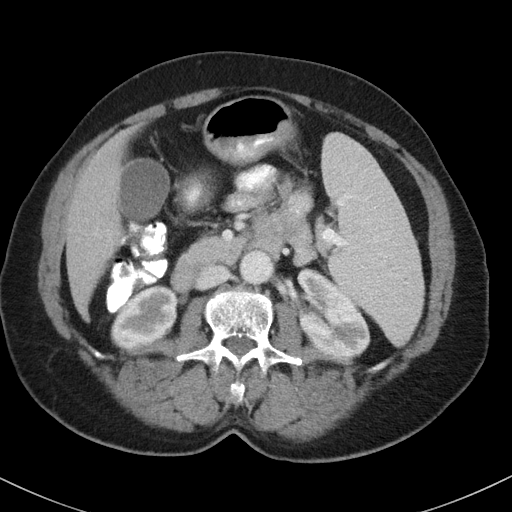
[im 53/79  bone]
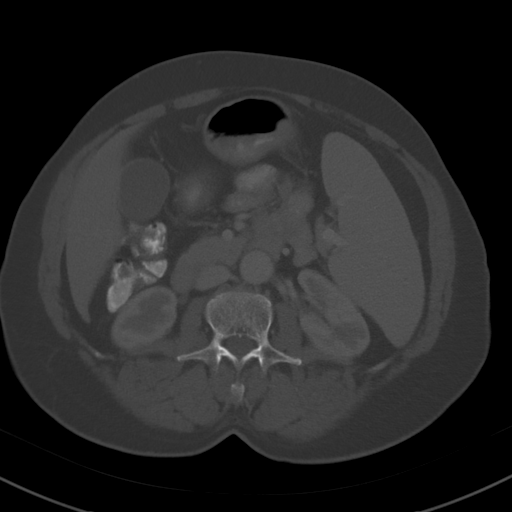
[im 57/79  soft-tissue]
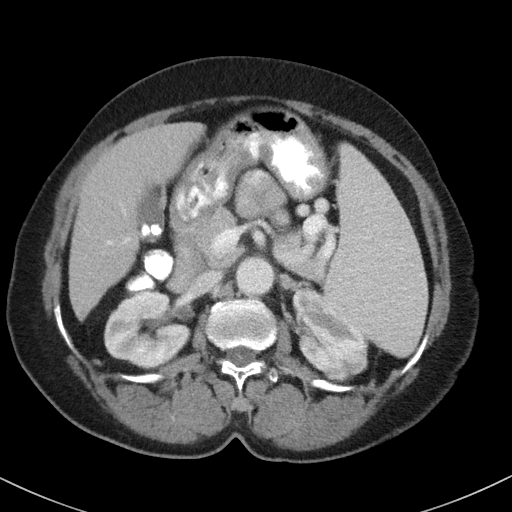
[im 61/79  soft-tissue]
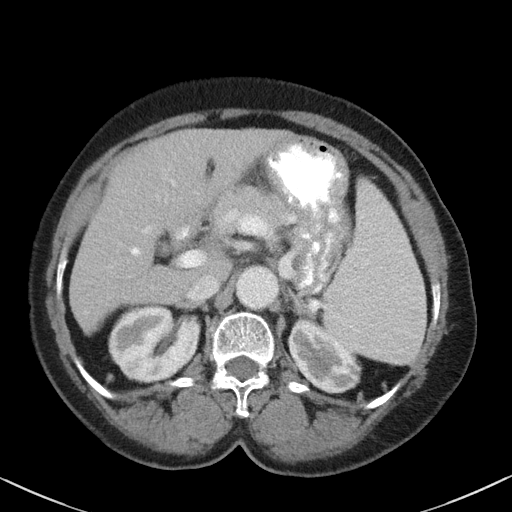
[im 70/79  soft-tissue]
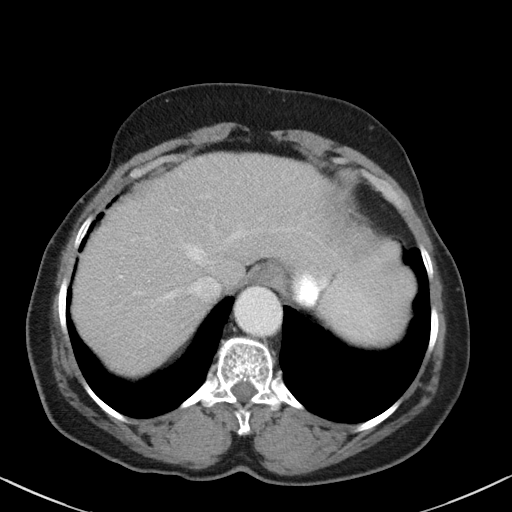
[im 74/79  soft-tissue]
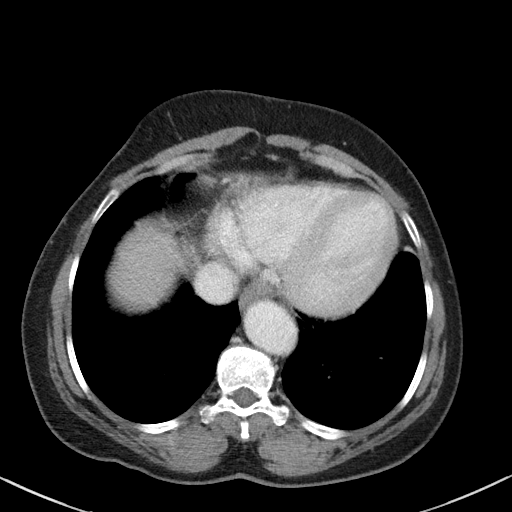

[Series 5: coronal st · coronal · 0.64mm/px · 3 of 83 slices shown]
[im 28/83  soft-tissue]
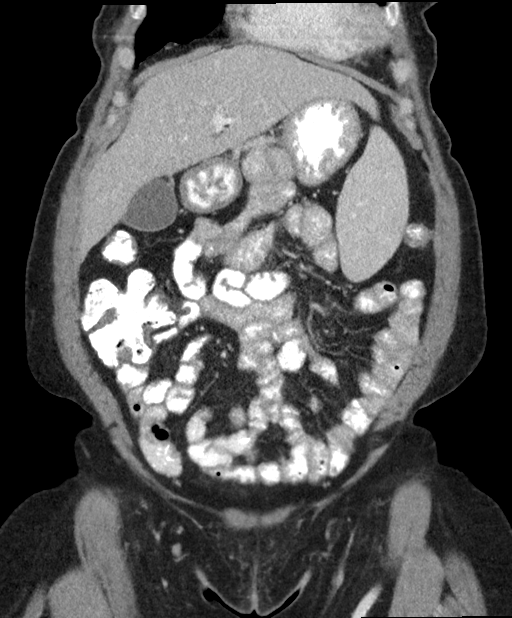
[im 37/83  soft-tissue]
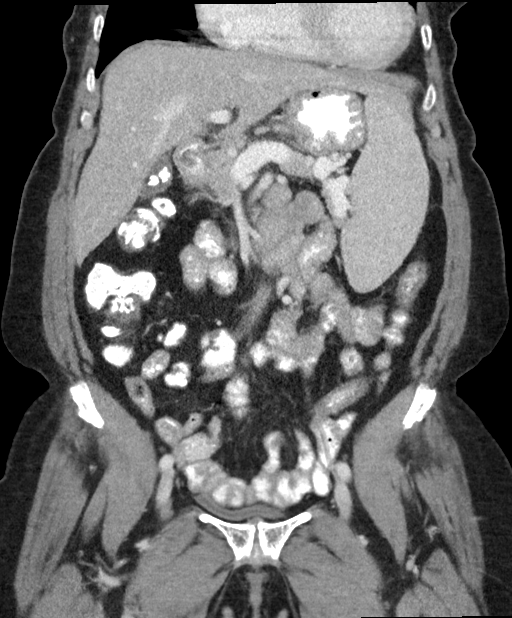
[im 46/83  soft-tissue]
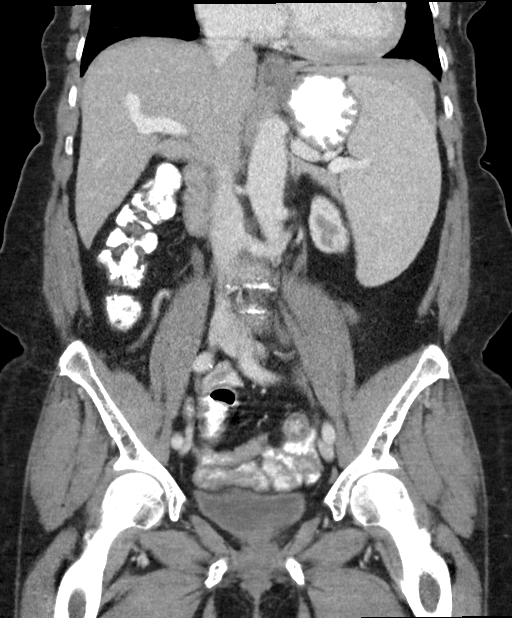

[16 of 46 positions shown; findings below may reference images not displayed]

FINDINGS: Lower chest: No acute abnormality.

Hepatobiliary: Cholelithiasis is noted without inflammation. No
biliary abnormality is noted. Liver is unremarkable.

Pancreas: Unremarkable. No pancreatic ductal dilatation or
surrounding inflammatory changes.

Spleen: Mild splenomegaly is noted without focal abnormality.
Maximum measured diameter of 14.6 cm.

Adrenals/Urinary Tract: Adrenal glands are unremarkable. Kidneys are
normal, without renal calculi, focal lesion, or hydronephrosis.
Bladder is unremarkable.

Stomach/Bowel: Stomach is within normal limits. Appendix appears
normal. No evidence of bowel wall thickening, distention, or
inflammatory changes.

Vascular/Lymphatic: No significant vascular findings are present. No
enlarged abdominal or pelvic lymph nodes.

Reproductive: Uterus is not clearly visualized and presumably the
patient is undergone hysterectomy. No definite adnexal abnormality
is noted.

Other: Moderate size fat containing periumbilical hernia is noted.
No abnormal fluid collection is noted.

Musculoskeletal: No acute or significant osseous findings.
IMPRESSION: Mild splenomegaly.

Cholelithiasis without inflammation.

Moderate size fat containing periumbilical hernia.

No other abnormality seen in the abdomen or pelvis.
